# Patient Record
Sex: Female | Born: 1950 | Race: White | Hispanic: No | Marital: Married | Smoking: Never smoker
Health system: Southern US, Community
[De-identification: ages and names within clinical notes are randomized; demographics above are authoritative.]

## PROBLEM LIST (undated history)

## (undated) DIAGNOSIS — E785 Hyperlipidemia, unspecified: Secondary | ICD-10-CM

## (undated) DIAGNOSIS — J302 Other seasonal allergic rhinitis: Secondary | ICD-10-CM

## (undated) DIAGNOSIS — Z972 Presence of dental prosthetic device (complete) (partial): Secondary | ICD-10-CM

## (undated) DIAGNOSIS — I10 Essential (primary) hypertension: Secondary | ICD-10-CM

## (undated) DIAGNOSIS — N951 Menopausal and female climacteric states: Secondary | ICD-10-CM

## (undated) DIAGNOSIS — R911 Solitary pulmonary nodule: Secondary | ICD-10-CM

## (undated) DIAGNOSIS — K5792 Diverticulitis of intestine, part unspecified, without perforation or abscess without bleeding: Secondary | ICD-10-CM

## (undated) DIAGNOSIS — Z87442 Personal history of urinary calculi: Secondary | ICD-10-CM

## (undated) HISTORY — DX: Diverticulitis of intestine, part unspecified, without perforation or abscess without bleeding: K57.92

## (undated) HISTORY — DX: Menopausal and female climacteric states: N95.1

## (undated) HISTORY — PX: APPENDECTOMY: SHX54

## (undated) HISTORY — PX: COLONOSCOPY: SHX174

## (undated) HISTORY — DX: Essential (primary) hypertension: I10

---

## 1999-06-07 ENCOUNTER — Other Ambulatory Visit: Admission: RE | Admit: 1999-06-07 | Discharge: 1999-06-07 | Payer: Self-pay | Admitting: *Deleted

## 1999-08-20 ENCOUNTER — Ambulatory Visit (HOSPITAL_BASED_OUTPATIENT_CLINIC_OR_DEPARTMENT_OTHER): Admission: RE | Admit: 1999-08-20 | Discharge: 1999-08-20 | Payer: Self-pay

## 2000-10-15 ENCOUNTER — Encounter (INDEPENDENT_AMBULATORY_CARE_PROVIDER_SITE_OTHER): Payer: Self-pay | Admitting: Specialist

## 2000-10-15 ENCOUNTER — Inpatient Hospital Stay (HOSPITAL_COMMUNITY): Admission: EM | Admit: 2000-10-15 | Discharge: 2000-10-16 | Payer: Self-pay | Admitting: *Deleted

## 2000-12-22 ENCOUNTER — Other Ambulatory Visit: Admission: RE | Admit: 2000-12-22 | Discharge: 2000-12-22 | Payer: Self-pay | Admitting: *Deleted

## 2000-12-22 ENCOUNTER — Encounter: Admission: RE | Admit: 2000-12-22 | Discharge: 2000-12-22 | Payer: Self-pay | Admitting: *Deleted

## 2003-09-08 ENCOUNTER — Other Ambulatory Visit: Admission: RE | Admit: 2003-09-08 | Discharge: 2003-09-08 | Payer: Self-pay | Admitting: *Deleted

## 2004-03-07 ENCOUNTER — Encounter: Payer: Self-pay | Admitting: Gastroenterology

## 2004-11-24 HISTORY — PX: BREAST BIOPSY: SHX20

## 2005-09-15 ENCOUNTER — Ambulatory Visit: Payer: Self-pay | Admitting: *Deleted

## 2005-10-20 ENCOUNTER — Ambulatory Visit: Payer: Self-pay | Admitting: *Deleted

## 2007-12-01 ENCOUNTER — Ambulatory Visit: Payer: Self-pay | Admitting: Family Medicine

## 2008-11-15 ENCOUNTER — Telehealth: Payer: Self-pay | Admitting: Gastroenterology

## 2008-11-21 ENCOUNTER — Ambulatory Visit: Payer: Self-pay | Admitting: Internal Medicine

## 2008-11-21 DIAGNOSIS — R1032 Left lower quadrant pain: Secondary | ICD-10-CM

## 2008-11-21 DIAGNOSIS — R933 Abnormal findings on diagnostic imaging of other parts of digestive tract: Secondary | ICD-10-CM

## 2008-11-21 DIAGNOSIS — K5732 Diverticulitis of large intestine without perforation or abscess without bleeding: Secondary | ICD-10-CM | POA: Insufficient documentation

## 2008-12-04 ENCOUNTER — Telehealth: Payer: Self-pay | Admitting: Physician Assistant

## 2008-12-11 ENCOUNTER — Ambulatory Visit: Payer: Self-pay | Admitting: Cardiology

## 2008-12-13 ENCOUNTER — Ambulatory Visit: Payer: Self-pay | Admitting: Gastroenterology

## 2009-01-19 ENCOUNTER — Ambulatory Visit: Payer: Self-pay | Admitting: Family Medicine

## 2010-01-23 ENCOUNTER — Ambulatory Visit: Payer: Self-pay | Admitting: Family Medicine

## 2011-04-11 NOTE — Op Note (Signed)
[facility]  Patient:    Anita Hutchinson, Anita Hutchinson                       MRN: [id number] Proc. Date: 10/15/00 Adm. Date:  [id number] Attending:  Brandy Hale CC:         Kizzie Furnish, M.D.  Pershing Cox, M.D.   Operative Report  PREOPERATIVE DIAGNOSIS:  Acute appendicitis.  POSTOPERATIVE DIAGNOSIS:  Acute appendicitis.  OPERATION PERFORMED:  Laparoscopic appendectomy.  SURGEON:  Angelia Mould. Derrell Lolling, M.D.  OPERATIVE INDICATIONS:  This is a 60 year old white female who presented to the [facility] Emergency Room with a 16-hour history of progressive periumbilical and right lower quadrant pain and nausea.  On examination, she had localized tenderness, guarding and direct rebound in the right lower quadrant.  White blood cell count was elevated to 13,800.  It was felt that she most likely had acute appendicitis and she was brought to the operating room for appendectomy.  OPERATIVE FINDINGS:  The patient had acute appendicitis.  The appendix was inflamed and discolored, it had some exudate it on it but it was not obviously gangrenous and was clearly not ruptured.  The cecum and right colon looked normal.  The terminal ileum was normal in appearance.  The uterus was of normal size.  The ovaries were not enlarged.  The liver and gallbladder and stomach looked normal.  There was no abscess.  OPERATIVE TECHNIQUE:  Following the induction of general endotracheal anesthesia, a Foley catheter was inserted and the patients abdomen was prepped and draped in a sterile fashion.  Marcaine 0.5% with epinephrine was used as a local infiltration anesthetic.  A vertical incision was made in the upper rim of the umbilicus.  The fascia was incised in the midline and the abdominal cavity entered under direct vision.  A 10-mm Hasson trocar was inserted and secured with a pursestring suture of 0 Vicryl; pneumoperitoneum was created.  Video camera was inserted, with  visualization and findings as described above.  A 5-mm trocar was placed in the right upper quadrant and a 12-mm trocar placed in the left suprapubic region.  The appendix was identified and elevated.  We used the harmonic scalpel for most of the dissection.  We divided the lateral peritoneal attachments and mobilized the appendix and the cecum up and away from the retroperitoneum.  We were then able to encircle the body of the appendix with an Endoloop tie.  We divided some of the mesoappendix with the harmonic scalpel.  We then isolated the base of the appendix and identified its junction with the cecum.  An Endo GIA stapling device was inserted around the appendix, closed, fired and removed; this divided the appendix very neatly at the appendix and cecum junction.  We then divided the rest of the mesoappendix with the harmonic scalpel, placed the appendix in a specimen bag and removed it.  The pelvis, operative field and right upper quadrant were then copiously irrigated with saline.  There was no bleeding whatsoever at the completion of the case.  We removed all the fluid from the abdomen.  We removed the trocar in the left lower quadrant and we had some bleeding from that trocar site; this was controlled with a 0 Vicryl suture placed full-thickness through the fascia and peritoneum using the Endoclose device; this provided excellent hemostasis.  The rest of the trocars were removed under direct vision and there was no bleeding from the trocar sites.  The pneumoperitoneum was released.  The fascia at the umbilicus was closed with 0 Vicryl suture.  The skin incision was closed with subcuticular sutures of 4-0 Vicryl and Steri-Strips.  Clean bandages were placed and the patient taken to the recovery room in stable condition. Estimated blood loss was about 25 cc.  Complications:  None.  Sponge, needle and instrument counts were correct. DD:  10/15/00 TD:  10/16/00 Job:  [id number]

## 2011-04-11 NOTE — H&P (Signed)
[facility]  Patient:    Anita Hutchinson, HACH                       MRN: [id number] Adm. Date:  [id number] Attending:  Brandy Hale CC:         Kizzie Furnish, M.D.  Garvin Fila, M.D.   History and Physical  CHIEF COMPLAINT:  Right lower quadrant pain and nausea.  HISTORY OF PRESENT ILLNESS:  This is a 60 year old white female in excellent health.  Yesterday, at 4:00 p.m., she developed some centralized abdominal pain which persisted through the night.  The pain became progressive and she came to the emergency room.  She states the pain has become more localized to the right lower quadrant.  It hurts when she coughs or sneezes and hurts to move or ride in a car.  She has been nauseated this morning, but has not vomited.  She denies fever.  She denies trauma.  She denies prior similar episodes.  Her last menstrual period was three years ago.  She does have a history of endometriosis.  She was evaluated by Dr. Chase Picket in the emergency department and he was concerned she had appendicitis and I was asked to see her for evaluation.  PAST MEDICAL HISTORY:  The patient is followed by Dr. Garvin Fila for routine gynecologic care.  She has had a D&C in the past and has been told she has endometriosis.  She had a right breast biopsy in 1999 for benign disease. She thinks Dr. Orpah Greek did this surgery.  She has no other medical or surgical problems.  CURRENT MEDICATIONS: 1. Actifed p.r.n. 2. Advil p.r.n. 3. Aspirin daily.  ALLERGIES:   None known.  SOCIAL HISTORY:  [place]  She is married.  They have two children and two grandchildren.  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  Her husband works as a carrier for the [employer].  She denies the use of tobacco.  She drinks one glass of wine about every other day.  FAMILY HISTORY:  Mother died at age 61 and had diabetes, severe peripheral vascular  disease and a pulmonary embolus.  Father died at age 28 and had insulin-dependent diabetes and had three strokes.  She has three sisters, one with diabetes.  She has three brothers, one with coronary artery disease.  REVIEW OF SYSTEMS:  All systems were reviewed and are negative except as described above.  PHYSICAL EXAMINATION:  GENERAL:  Pleasant, healthy, thin, middle-age white female in moderate distress from abdominal pain.  She moves slowly because of pain.  VITAL SIGNS:  Blood pressure 93/54, heart rate 75, respiratory rate 18, temperature 97.5.  HEENT:  Sclerae clear.  Extraocular movements intact.  Oropharynx clear.  NECK:  Supple, nontender, no mass, no thyromegaly, no adenopathy, no bruits.  LUNGS:  Clear to auscultation.  No CVA tenderness.  HEART:  Regular rate and rhythm.  Slightly tachycardic at the time of exam, no murmur.  ABDOMEN:  Not distended.  Hypoactive bowel sounds.  Soft.  She has localized tenderness.  Involuntary guarding.  Direct and referred rebound to the right lower quadrant.  There is no obvious mass.  Suprapubic area is nontender.  EXTREMITIES:  No edema, good pulses.  NEUROLOGIC:  Grossly within normal limits.  ADMISSION LABORATORY DATA:  White blood cell count 13,800, hemoglobin 13.1, complete metabolic panel essentially normal, amylase and lipase normal, urinalysis normal.  IMPRESSION:  1. Probable acute appendicitis. 2. History of endometriosis.  PLAN:  I advised the patient of her probable diagnosis.  She was advised to undergo diagnostic laparoscopy and appendectomy.  Differential diagnosis was discussed including Crohns disease, diverticulitis, gynecologic problems, viral illness and so forth.  Indications and details of the surgery were outlined.  Risks and complications were outlined in great detail, including, but not limited to bleeding, infection, injury to the intestinal track or urologic track, wound infection, conversion to  open laparotomy and other unforeseen complications.  She seems to understand all of these issues well. At this time, I have answered all the questions from she and her husband. They agree with this plan.  She will be started on antibiotics and will take her to the operating room this morning for appendectomy. DD:  10/15/00 TD:  10/15/00 Job: [id number]

## 2011-04-19 ENCOUNTER — Encounter: Payer: Self-pay | Admitting: Gastroenterology

## 2011-12-01 ENCOUNTER — Ambulatory Visit: Payer: Self-pay | Admitting: Family Medicine

## 2012-06-23 ENCOUNTER — Ambulatory Visit: Payer: Self-pay | Admitting: Family Medicine

## 2012-09-27 ENCOUNTER — Ambulatory Visit: Payer: Self-pay | Admitting: Family Medicine

## 2012-12-01 ENCOUNTER — Ambulatory Visit: Payer: Self-pay | Admitting: Family Medicine

## 2013-03-28 ENCOUNTER — Ambulatory Visit: Payer: Self-pay | Admitting: Family Medicine

## 2013-04-25 ENCOUNTER — Inpatient Hospital Stay: Payer: Self-pay | Admitting: Surgery

## 2013-04-25 LAB — CBC
HCT: 37.2 % (ref 35.0–47.0)
HGB: 13 g/dL (ref 12.0–16.0)
MCH: 32 pg (ref 26.0–34.0)
MCHC: 34.9 g/dL (ref 32.0–36.0)
MCV: 92 fL (ref 80–100)
Platelet: 320 10*3/uL (ref 150–440)
RBC: 4.06 10*6/uL (ref 3.80–5.20)
RDW: 13 % (ref 11.5–14.5)
WBC: 11.1 10*3/uL — ABNORMAL HIGH (ref 3.6–11.0)

## 2013-04-25 LAB — URINALYSIS, COMPLETE
Bilirubin,UR: NEGATIVE
Ketone: NEGATIVE
Ph: 6 (ref 4.5–8.0)
RBC,UR: 2 /HPF (ref 0–5)
Specific Gravity: 1.008 (ref 1.003–1.030)
Squamous Epithelial: 4
WBC UR: 2 /HPF (ref 0–5)

## 2013-04-25 LAB — COMPREHENSIVE METABOLIC PANEL
Albumin: 3.3 g/dL — ABNORMAL LOW (ref 3.4–5.0)
Alkaline Phosphatase: 161 U/L — ABNORMAL HIGH (ref 50–136)
Anion Gap: 7 (ref 7–16)
BUN: 11 mg/dL (ref 7–18)
Bilirubin,Total: 0.6 mg/dL (ref 0.2–1.0)
Calcium, Total: 9.2 mg/dL (ref 8.5–10.1)
Chloride: 104 mmol/L (ref 98–107)
Co2: 29 mmol/L (ref 21–32)
Creatinine: 0.55 mg/dL — ABNORMAL LOW (ref 0.60–1.30)
EGFR (African American): >60
EGFR (Non-African Amer.): >60
Glucose: 104 mg/dL — ABNORMAL HIGH (ref 65–99)
Osmolality: 279 (ref 275–301)
Potassium: 4 mmol/L (ref 3.5–5.1)
SGOT(AST): 52 U/L — ABNORMAL HIGH (ref 15–37)
SGPT (ALT): 94 U/L — ABNORMAL HIGH (ref 12–78)
Sodium: 140 mmol/L (ref 136–145)
Total Protein: 7.9 g/dL (ref 6.4–8.2)

## 2013-04-25 LAB — PROTIME-INR: INR: 1

## 2013-04-25 LAB — LIPASE, BLOOD: Lipase: 150 U/L (ref 73–393)

## 2013-04-26 LAB — COMPREHENSIVE METABOLIC PANEL
Alkaline Phosphatase: 139 U/L — ABNORMAL HIGH (ref 50–136)
BUN: 6 mg/dL — ABNORMAL LOW (ref 7–18)
Bilirubin,Total: 0.3 mg/dL (ref 0.2–1.0)
Calcium, Total: 8.6 mg/dL (ref 8.5–10.1)
Chloride: 109 mmol/L — ABNORMAL HIGH (ref 98–107)
Creatinine: 0.6 mg/dL (ref 0.60–1.30)
EGFR (African American): >60
EGFR (Non-African Amer.): >60
Total Protein: 6.8 g/dL (ref 6.4–8.2)

## 2013-04-26 LAB — CBC WITH DIFFERENTIAL/PLATELET
Basophil #: 0 10*3/uL (ref 0.0–0.1)
Basophil %: 0.5 %
Eosinophil %: 5.1 %
HCT: 34.6 % — ABNORMAL LOW (ref 35.0–47.0)
Lymphocyte #: 1.7 10*3/uL (ref 1.0–3.6)
Lymphocyte %: 22.8 %
MCH: 31.8 pg (ref 26.0–34.0)
MCHC: 34.6 g/dL (ref 32.0–36.0)
Monocyte #: 0.8 x10 3/mm (ref 0.2–0.9)
Monocyte %: 10.4 %
Neutrophil %: 61.2 %
RDW: 12.6 % (ref 11.5–14.5)
WBC: 7.3 10*3/uL (ref 3.6–11.0)

## 2013-04-27 LAB — PATHOLOGY REPORT

## 2013-05-18 ENCOUNTER — Inpatient Hospital Stay: Payer: Self-pay | Admitting: Surgery

## 2013-05-18 LAB — CBC WITH DIFFERENTIAL/PLATELET
Basophil #: 0 10*3/uL (ref 0.0–0.1)
Basophil %: 0.4 %
Eosinophil #: 0.1 10*3/uL (ref 0.0–0.7)
Eosinophil %: 1.2 %
HGB: 13.8 g/dL (ref 12.0–16.0)
Lymphocyte %: 8 %
Monocyte #: 0.5 x10 3/mm (ref 0.2–0.9)
Neutrophil #: 9.7 10*3/uL — ABNORMAL HIGH (ref 1.4–6.5)
RDW: 13.2 % (ref 11.5–14.5)
WBC: 11.3 10*3/uL — ABNORMAL HIGH (ref 3.6–11.0)

## 2013-05-18 LAB — BASIC METABOLIC PANEL
Anion Gap: 7 (ref 7–16)
Calcium, Total: 9.2 mg/dL (ref 8.5–10.1)
Chloride: 105 mmol/L (ref 98–107)
Co2: 26 mmol/L (ref 21–32)
Creatinine: 0.66 mg/dL (ref 0.60–1.30)
EGFR (African American): >60
EGFR (Non-African Amer.): >60
Glucose: 94 mg/dL (ref 65–99)
Potassium: 3.9 mmol/L (ref 3.5–5.1)
Sodium: 138 mmol/L (ref 136–145)

## 2013-05-19 LAB — CBC WITH DIFFERENTIAL/PLATELET
Basophil #: 0 10*3/uL (ref 0.0–0.1)
Basophil %: 0.4 %
Eosinophil #: 0.4 10*3/uL (ref 0.0–0.7)
Eosinophil %: 4.8 %
HCT: 39.5 % (ref 35.0–47.0)
HGB: 13.6 g/dL (ref 12.0–16.0)
Lymphocyte %: 15.9 %
MCHC: 34.5 g/dL (ref 32.0–36.0)
Monocyte #: 0.7 x10 3/mm (ref 0.2–0.9)
Monocyte %: 7.4 %
Neutrophil #: 6.6 10*3/uL — ABNORMAL HIGH (ref 1.4–6.5)
Neutrophil %: 71.5 %
Platelet: 253 10*3/uL (ref 150–440)
RDW: 12.9 % (ref 11.5–14.5)

## 2013-05-19 LAB — BASIC METABOLIC PANEL
Anion Gap: 8 (ref 7–16)
BUN: 3 mg/dL — ABNORMAL LOW (ref 7–18)
Calcium, Total: 8.9 mg/dL (ref 8.5–10.1)
Co2: 26 mmol/L (ref 21–32)
Creatinine: 0.69 mg/dL (ref 0.60–1.30)
EGFR (African American): >60
Glucose: 112 mg/dL — ABNORMAL HIGH (ref 65–99)
Osmolality: 280 (ref 275–301)
Sodium: 142 mmol/L (ref 136–145)

## 2013-06-09 ENCOUNTER — Other Ambulatory Visit: Payer: Self-pay | Admitting: Unknown Physician Specialty

## 2013-06-09 LAB — CLOSTRIDIUM DIFFICILE BY PCR

## 2013-06-12 LAB — STOOL CULTURE

## 2013-07-19 ENCOUNTER — Ambulatory Visit: Payer: Self-pay | Admitting: Unknown Physician Specialty

## 2013-11-02 IMAGING — CR DG CHEST 2V
1 series · 3 of 3 positions shown · non-contrast
Comparison: none

REASON FOR EXAM: pre-op
COMMENTS:

PROCEDURE:     DXR - DXR CHEST PA (OR AP) AND LATERAL  - April 25, 2013  [DATE]
RESULT:

[Series 1: w chest lat · 0.14mm/px · 3 of 3 slices shown]
[im 1/3]
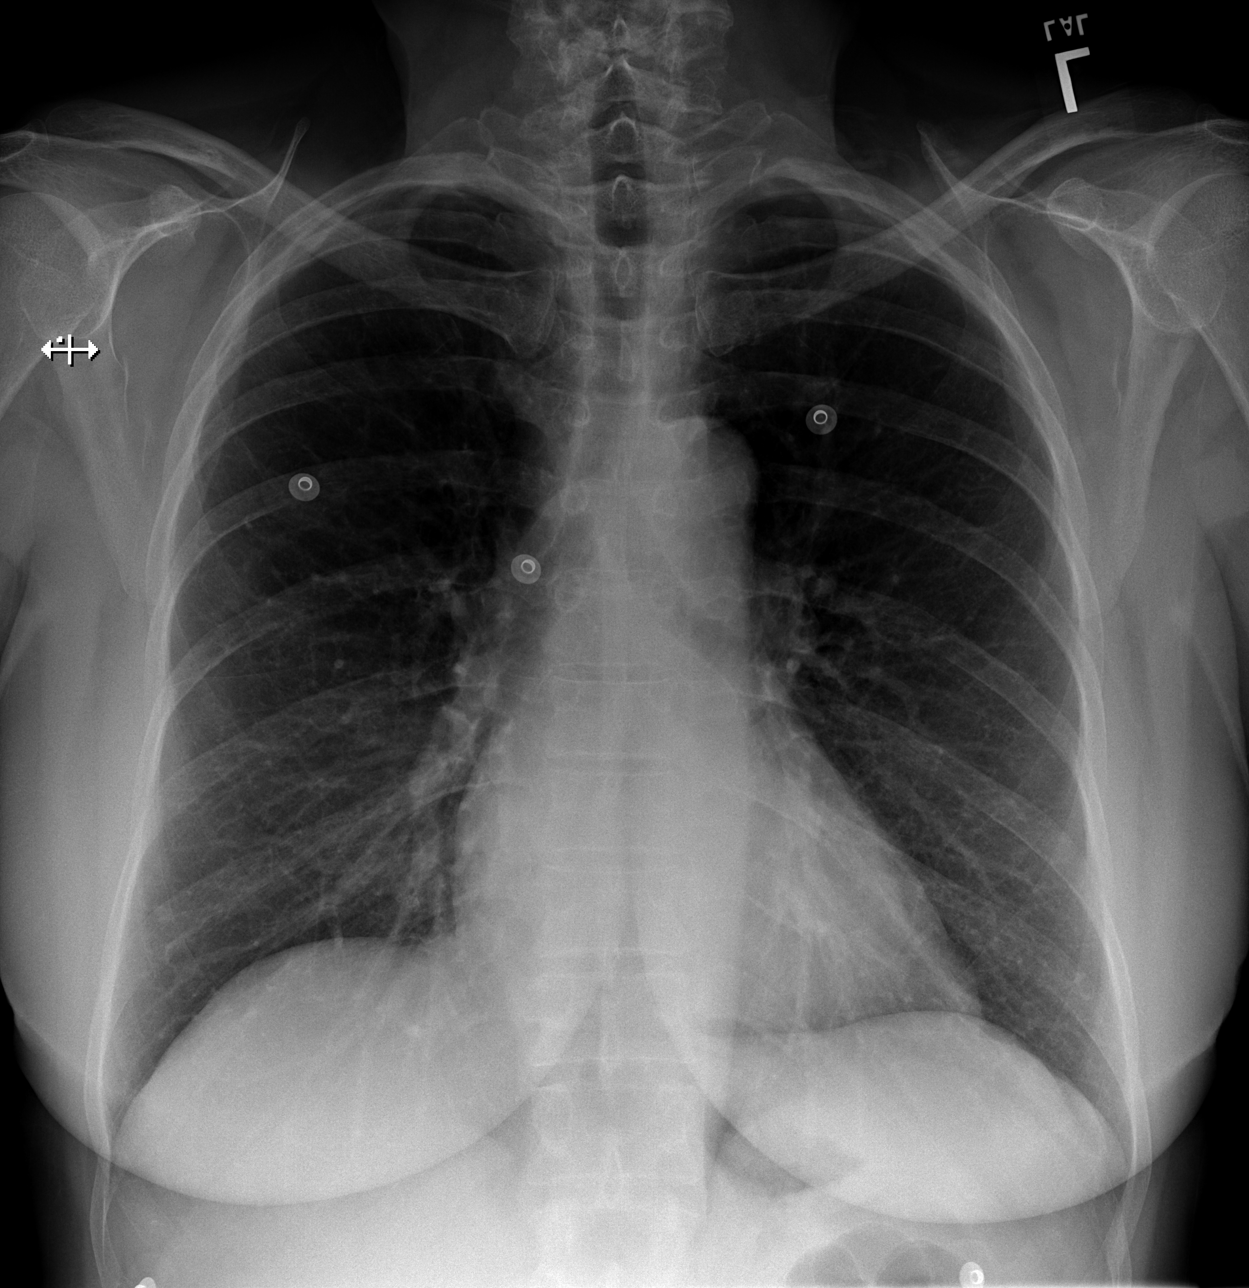
[im 2/3]
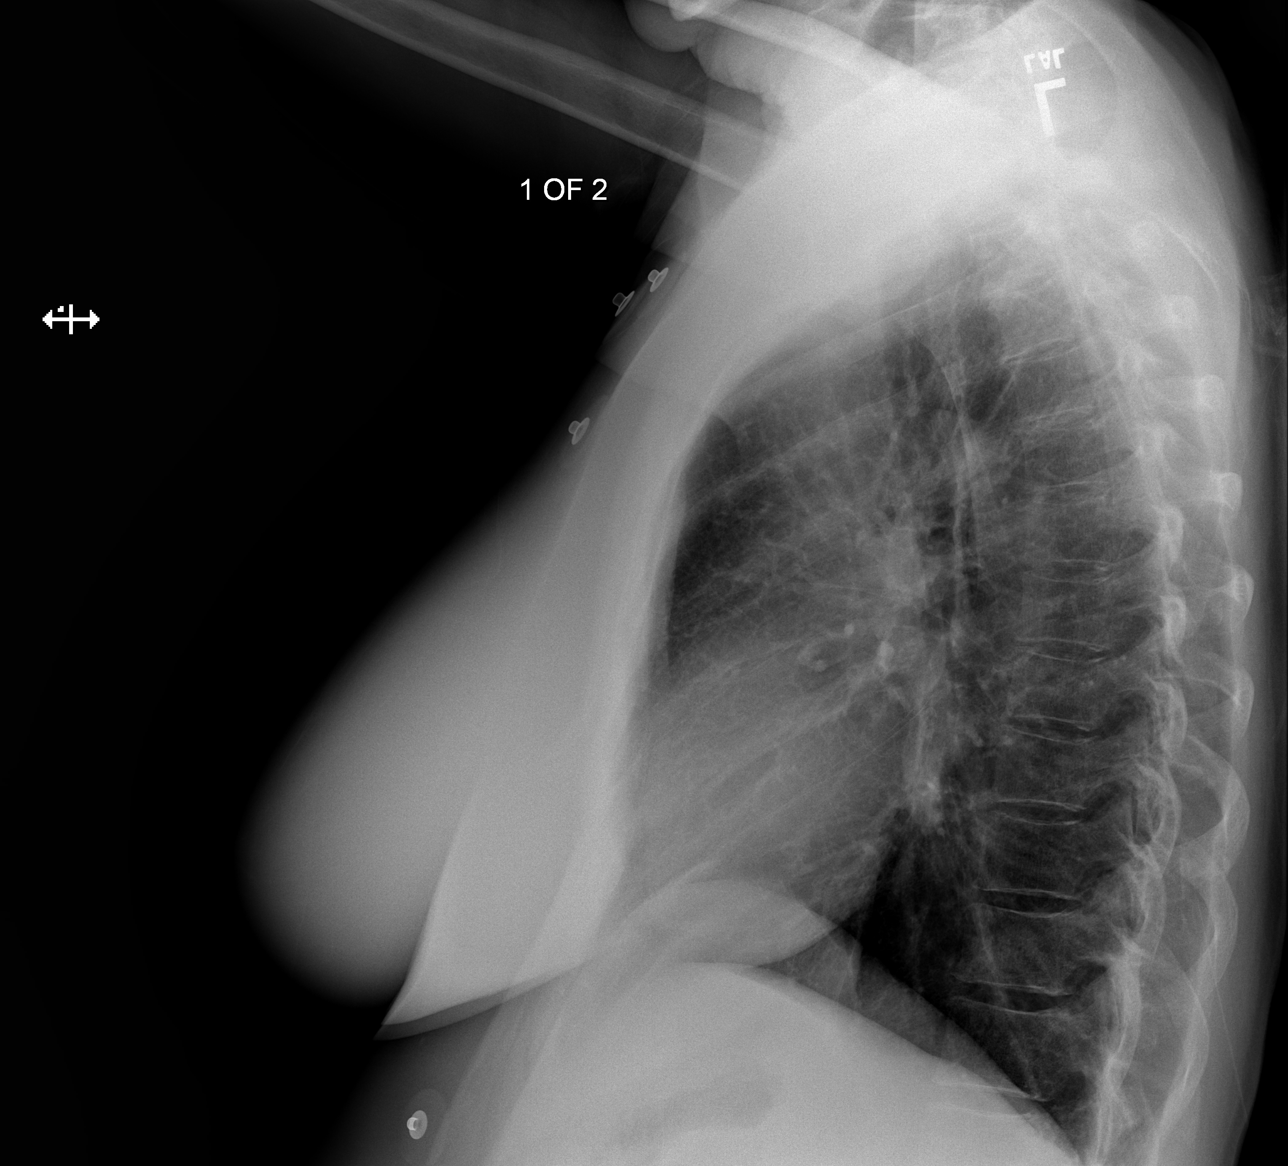
[im 3/3]
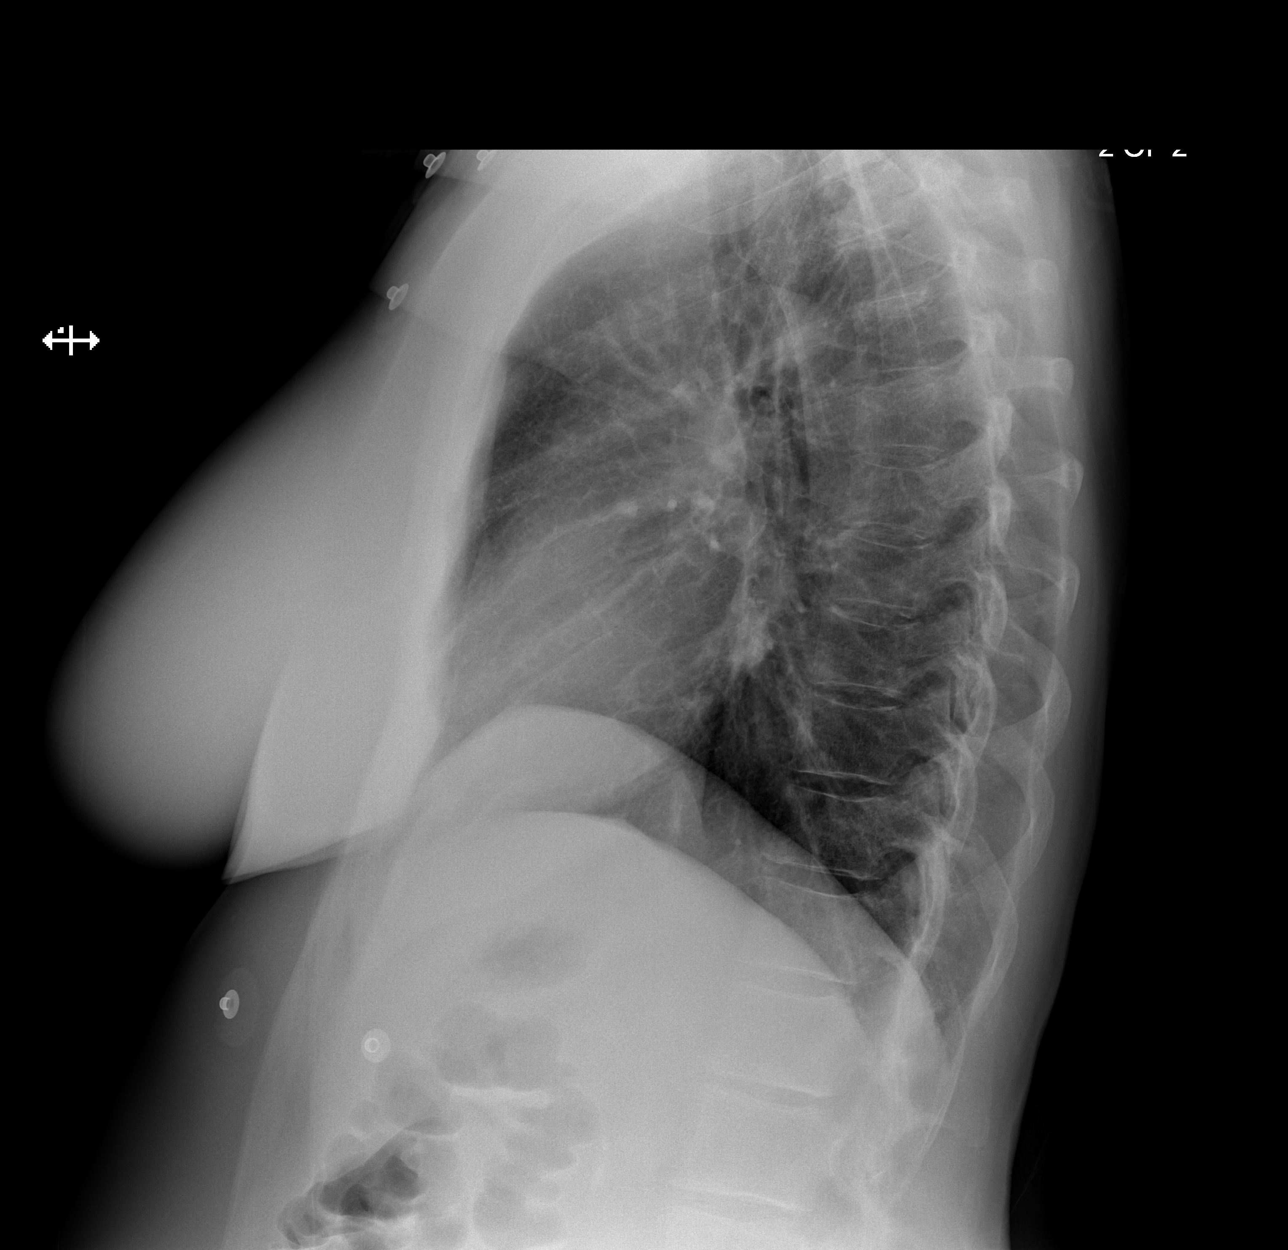

[3 of 3 positions shown; findings below may reference images not displayed]

FINDINGS: There is no evidence of focal infiltrates, effusions or edema. The
cardiac silhouette and visualized bony skeleton are unremarkable. A small
vague nodular appearing density projects along the peripheral base of the
right lower lobe. This appears to correlate with a stable nodule appreciated
on prior chest CT.
IMPRESSION: No evidence of acute cardiopulmonary disease.

## 2013-11-02 IMAGING — CT CT ABD-PELV W/ CM
1 of 2 series · 14 of 32 positions shown, 18 images · non-contrast
Comparison: none

REASON FOR EXAM: (1) lower abd pain; (2) lower abd pain
COMMENTS:

[Series 2: 3mm soft tissue · axial · 0.76mm/px · z∈[-482,-53]mm · 14 of 157 slices shown, 18 images]
[im 7/157  soft-tissue]
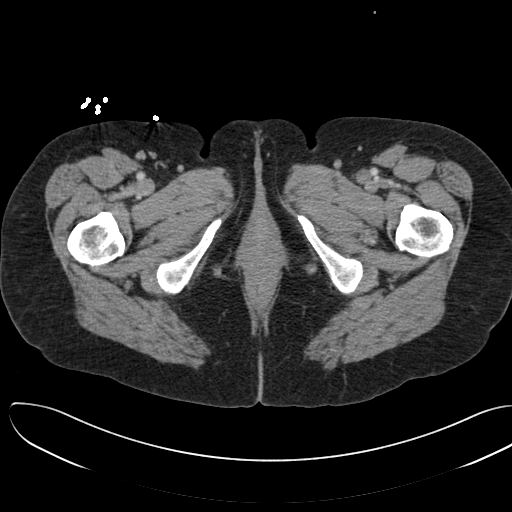
[im 7/157  bone]
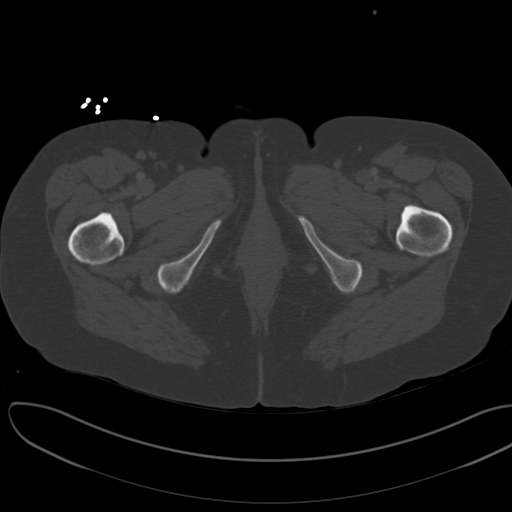
[im 21/157  soft-tissue]
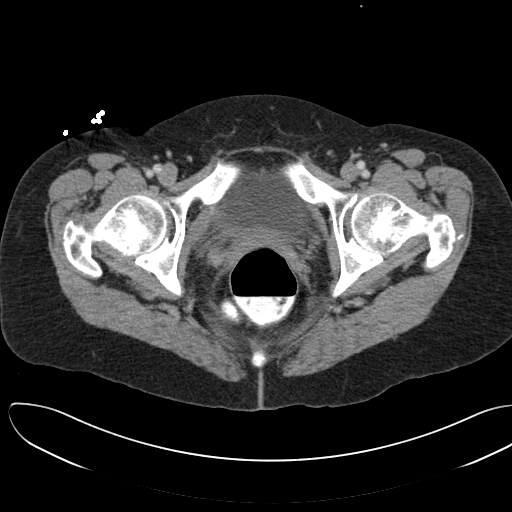
[im 34/157  soft-tissue]
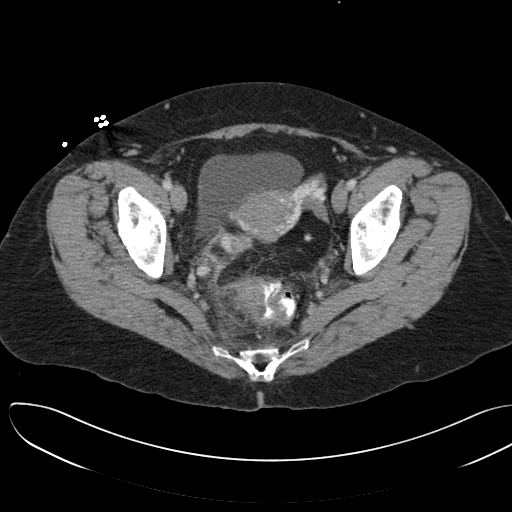
[im 48/157  soft-tissue]
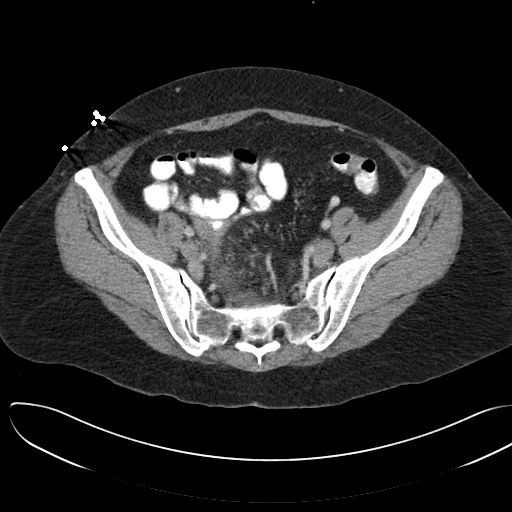
[im 62/157  soft-tissue]
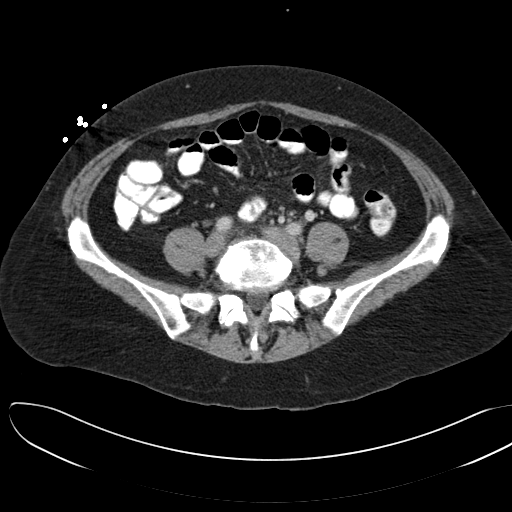
[im 75/157  soft-tissue]
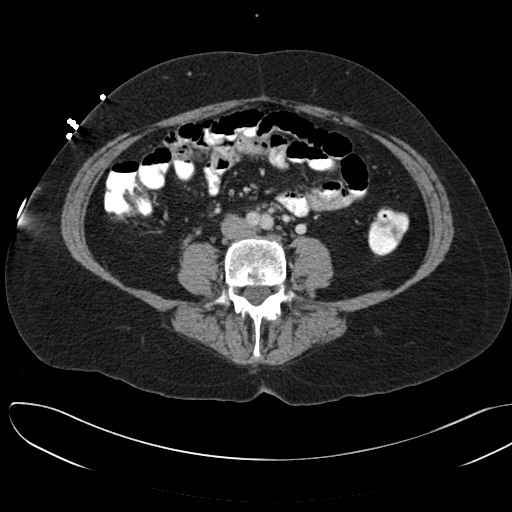
[im 82/157  soft-tissue]
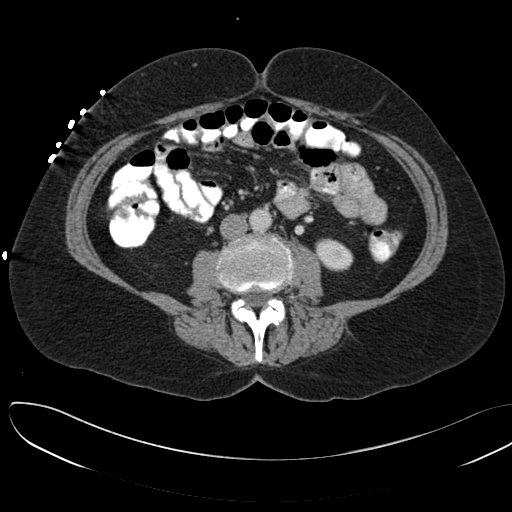
[im 95/157  soft-tissue]
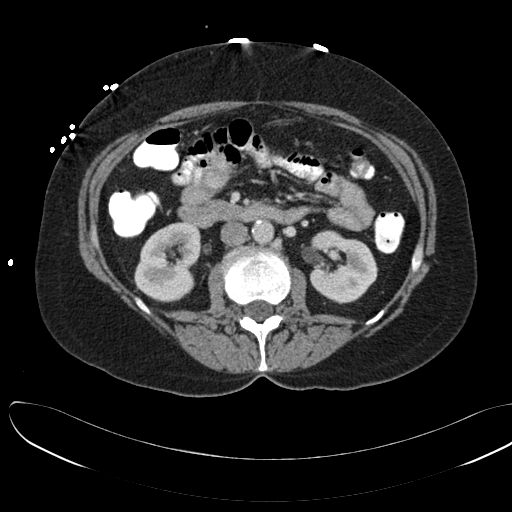
[im 109/157  soft-tissue]
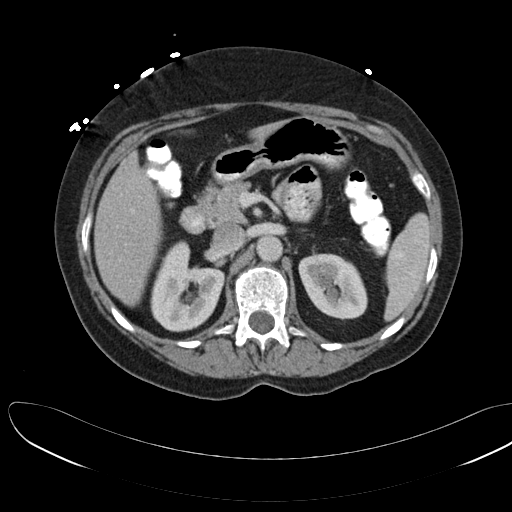
[im 109/157  bone]
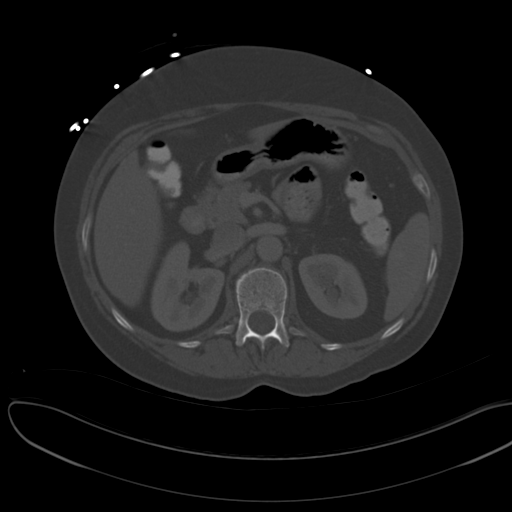
[im 123/157  soft-tissue]
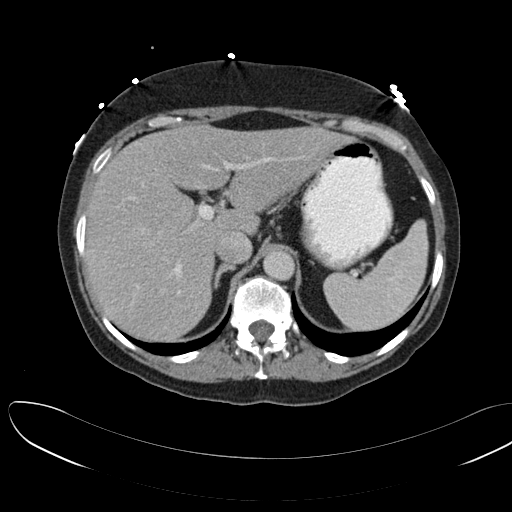
[im 129/157  lung]
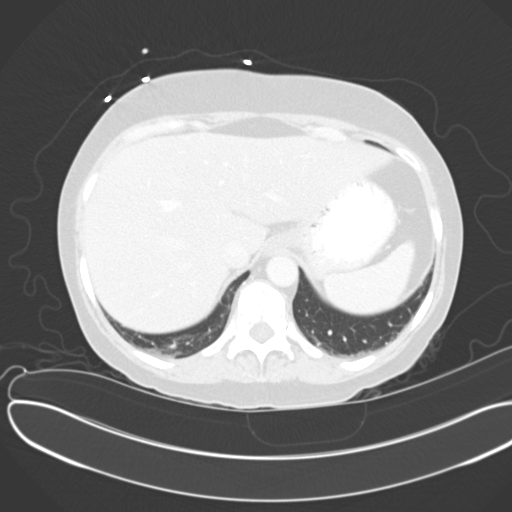
[im 136/157  soft-tissue]
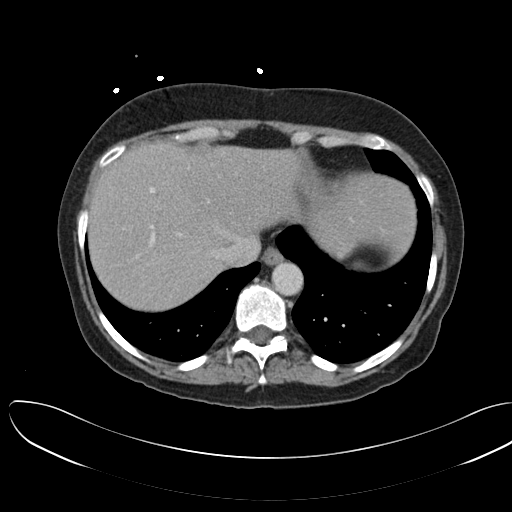
[im 136/157  lung]
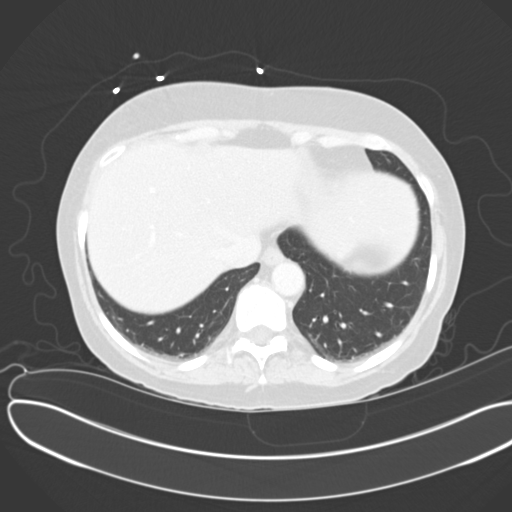
[im 143/157  lung]
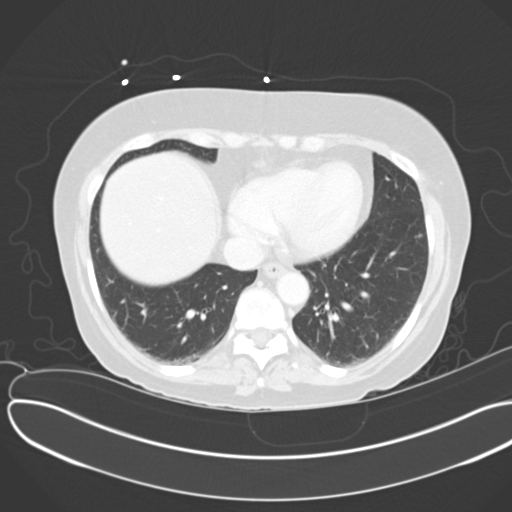
[im 150/157  soft-tissue]
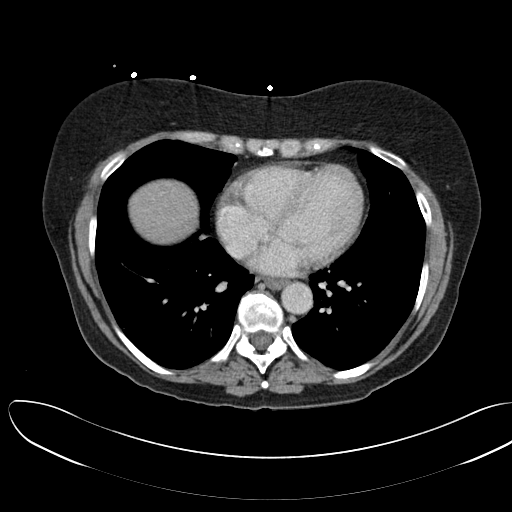
[im 150/157  lung]
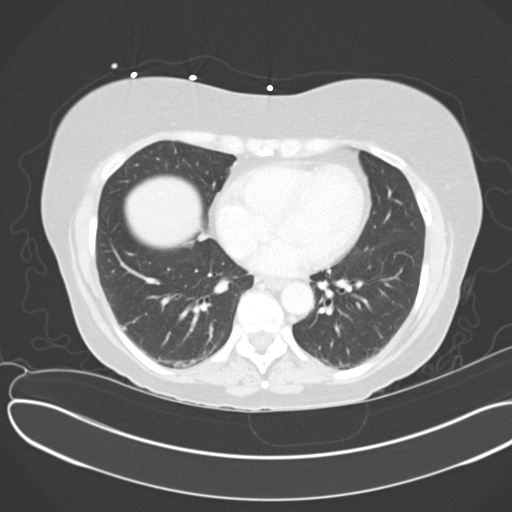

[14 of 32 positions shown; findings below may reference images not displayed]

PROCEDURE:     CT  - CT ABDOMEN / PELVIS  W  - April 25, 2013  [DATE]

RESULT:     CT of the abdomen and pelvis is performed with 85 mL of
Msovue-MDD iodinated intravenous contrast and oral contrast with images
reconstructed at 3.0 mm slice thickness in the axial plane compared to
previous studies of 06/23/2012. There is previous CT of the chest and upper
abdomen dated 09/27/2012 performed without contrast.

There is an abnormal appearance in the pelvis in the area of the
rectosigmoid portion of colon. There is extraluminal gas along the right
lateral aspect of the colon demonstrated between images 116 and 123. A
well-defined abscess is not appreciated. There is a small amount of free
fluid. There does not appear to be significant pneumoperitoneum. There is no
evidence of bowel obstruction as oral contrast has reached the rectum. There
is no extravasation of contrast evident. There significant bowel wall
thickening in the area of concern. There is extensive sigmoid colon
diverticulosis. The appearance could be secondary to acute diverticulitis
with perforation. The possibility of colitis or underlying malignancy is not
excluded. Diverticulosis is seen in the descending colon as well. The
kidneys show no obstruction or mass. No radiopaque gallstones or renal
calculi are evident. The liver shows no discrete mass. The liver and spleen
enhance normally. The pancreas is unremarkable. There is atherosclerotic
calcification within the abdominal aorta. The urinary bladder is
unremarkable. The uterus appears to be within normal limits. The appendix is
not definitely identified but there is no evidence of acute inflammatory
process to suggest acute appendicitis. The lung bases show some dependent
atelectasis. There is some mild degenerative disc narrowing at L2-L3 and
L1-L2 with hypertrophic endplate spurring. Presacral edema is present.
IMPRESSION: 1. Extraluminal air is seen adjacent to the distal right sigmoid colon and
rectosigmoid region with considerable thickening of the wall in the area the
proximal rectum to rectosigmoid and distal sigmoid region. There is a small
amount of free fluid present. Surrounding inflammatory stranding is present.
The changes could be secondary to evolving abscess formation from
perforation secondary to acute diverticulitis. The possibility of colitis or
underlying malignancy should also be considered.

[REDACTED](*)

## 2013-11-25 IMAGING — CT CT ABD-PELV W/ CM
1 of 2 series · 15 of 32 positions shown, 19 images · non-contrast
Comparison: none

REASON FOR EXAM: (1) recurrent diverticulitis; (2) same
COMMENTS:

[Series 2: soft tissue · axial · 0.70mm/px · z∈[-1000,-562]mm · 15 of 160 slices shown, 19 images]
[im 7/160  soft-tissue]
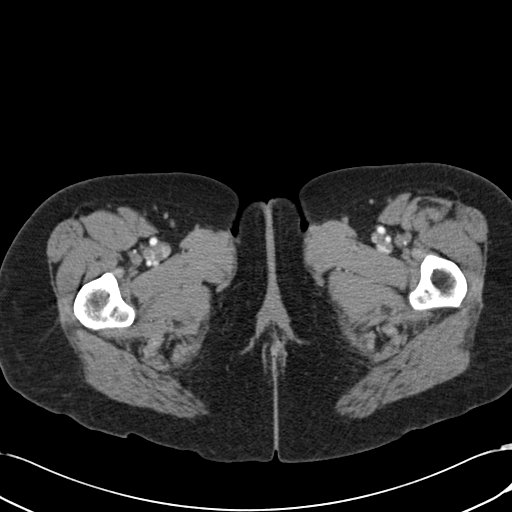
[im 7/160  bone]
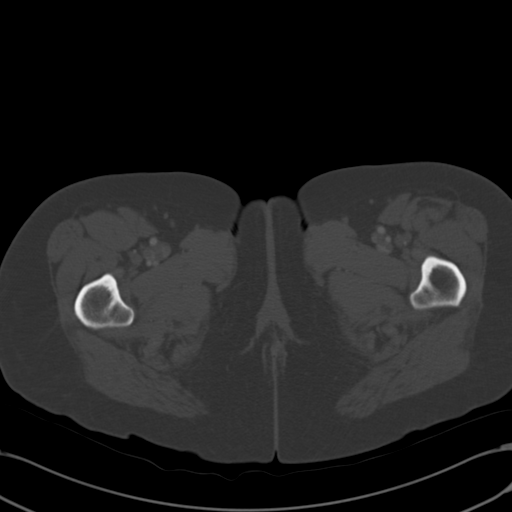
[im 21/160  soft-tissue]
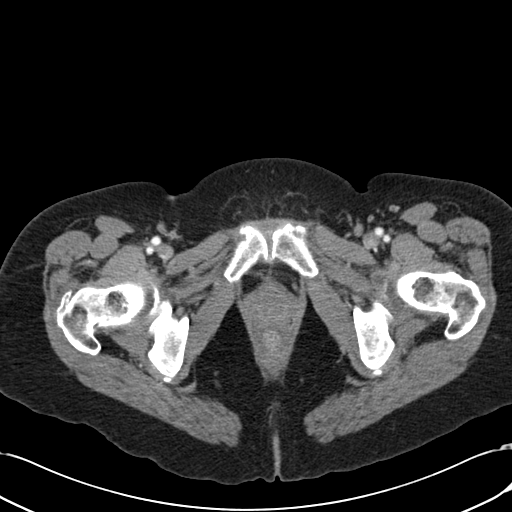
[im 35/160  soft-tissue]
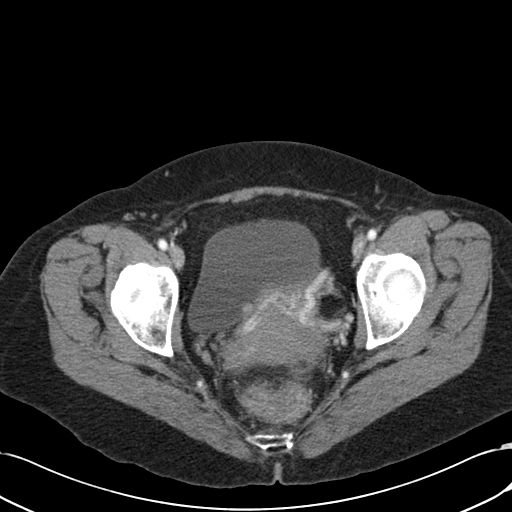
[im 42/160  soft-tissue]
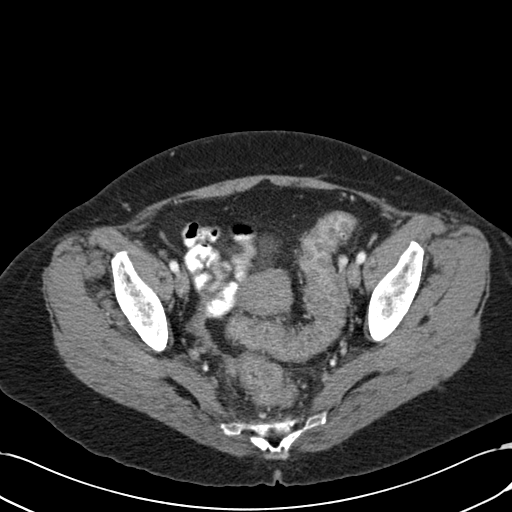
[im 56/160  soft-tissue]
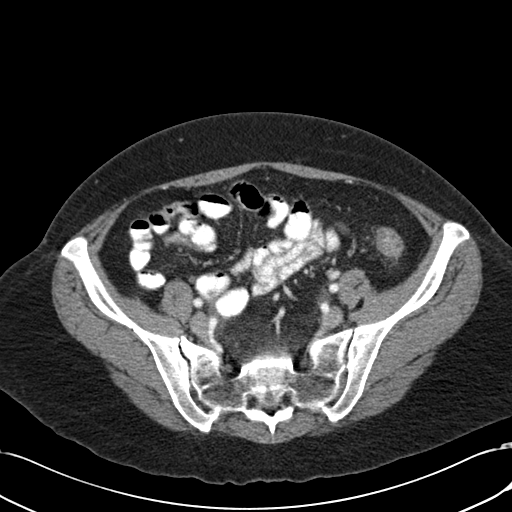
[im 70/160  soft-tissue]
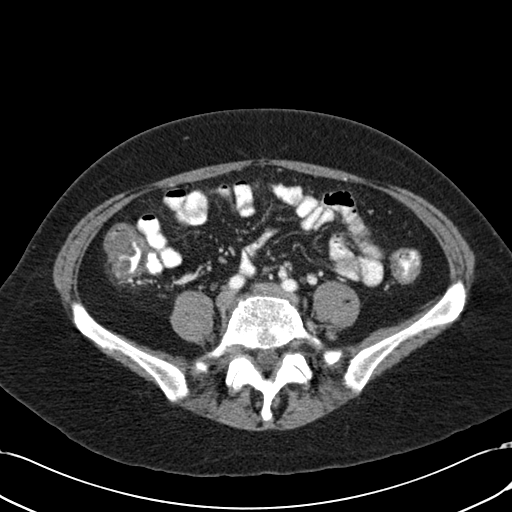
[im 83/160  soft-tissue]
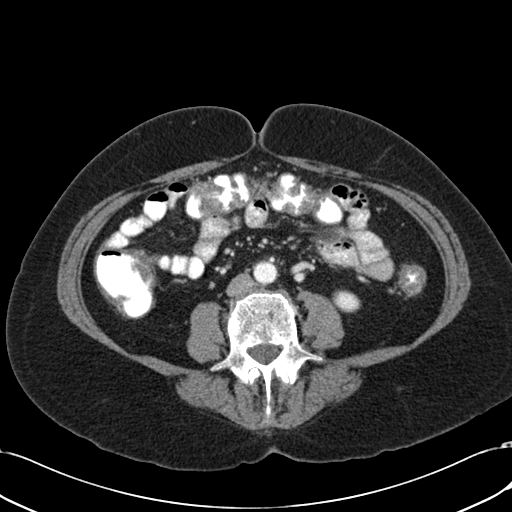
[im 90/160  soft-tissue]
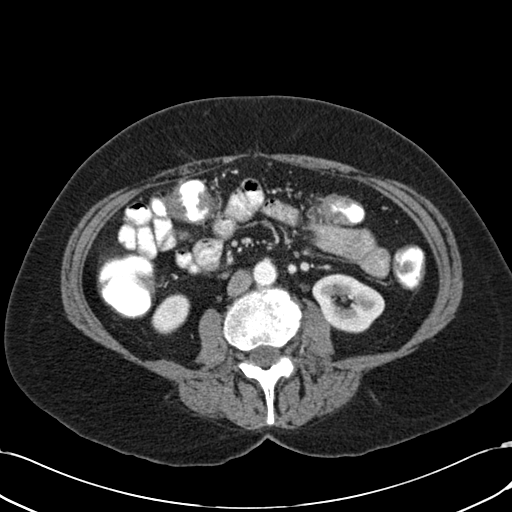
[im 104/160  soft-tissue]
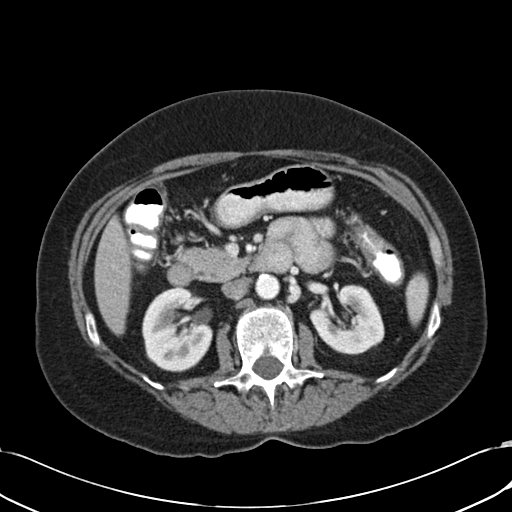
[im 104/160  bone]
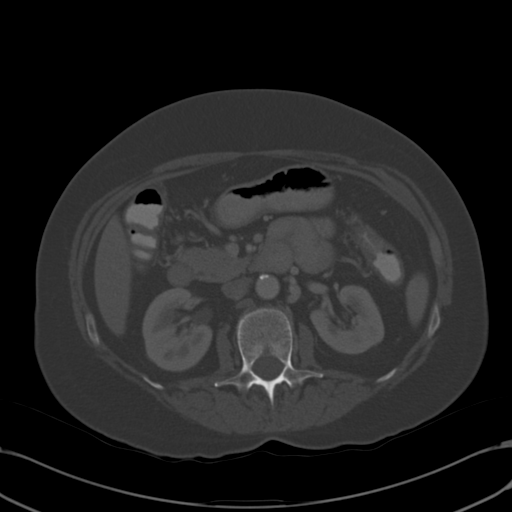
[im 118/160  soft-tissue]
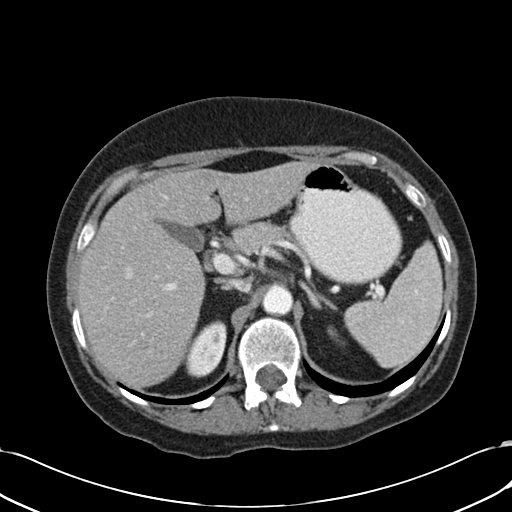
[im 125/160  soft-tissue]
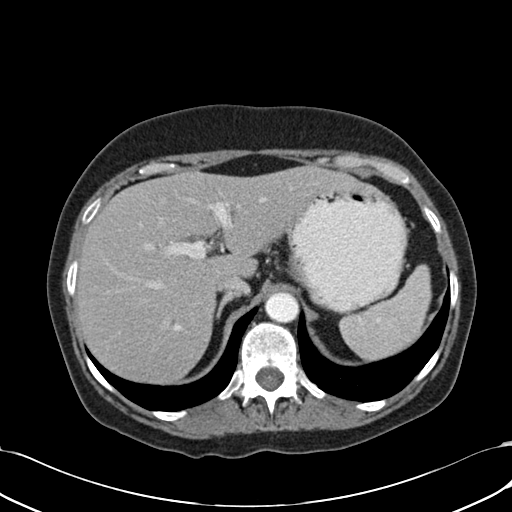
[im 132/160  lung]
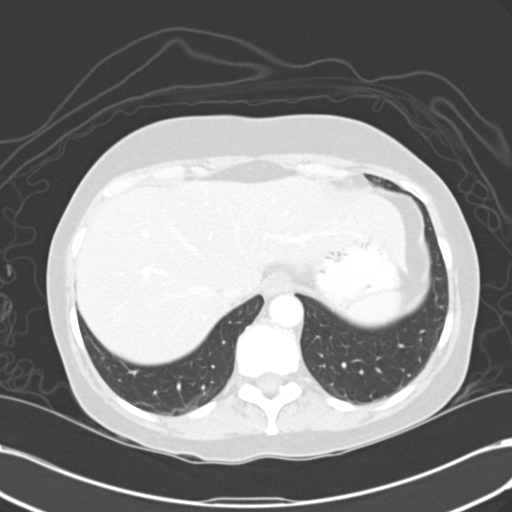
[im 139/160  soft-tissue]
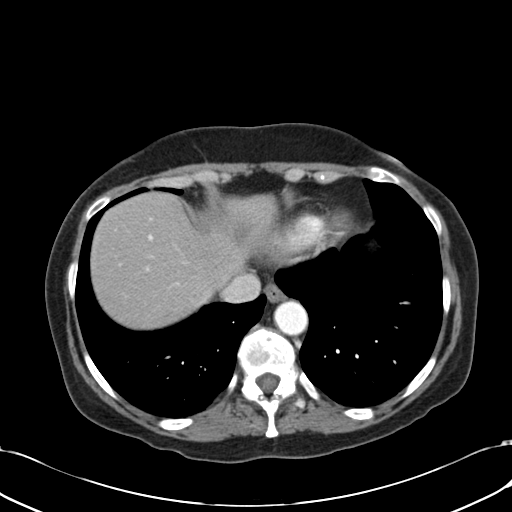
[im 139/160  lung]
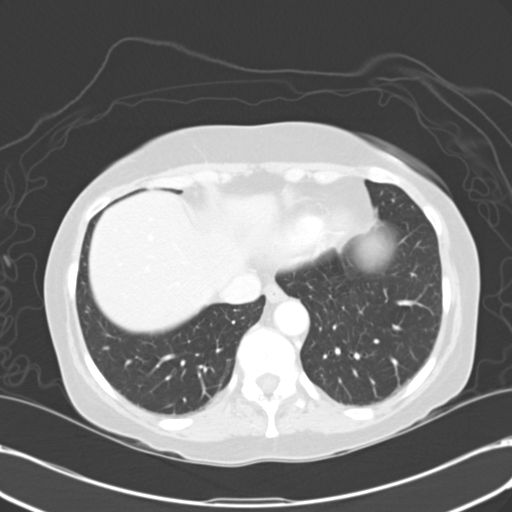
[im 146/160  lung]
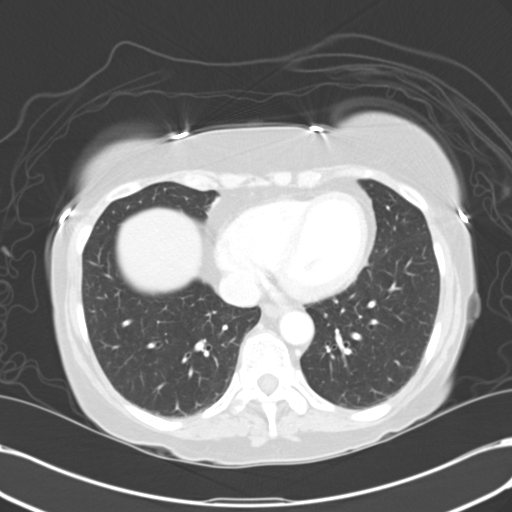
[im 153/160  soft-tissue]
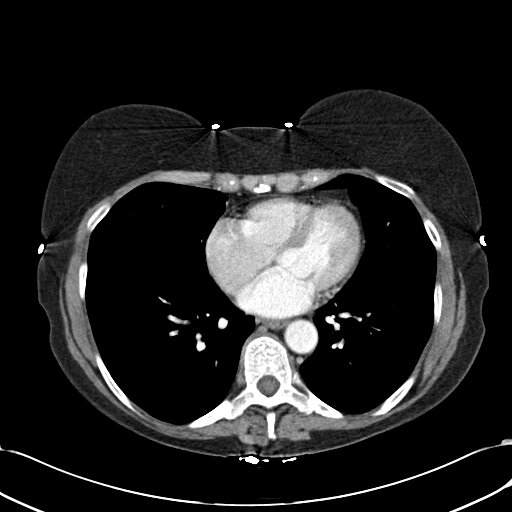
[im 153/160  lung]
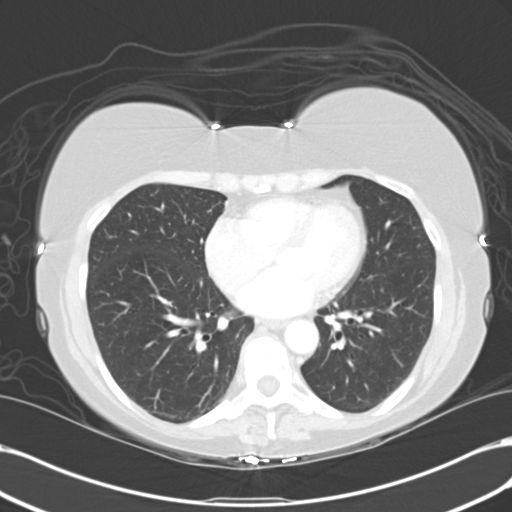

[15 of 32 positions shown; findings below may reference images not displayed]

PROCEDURE:     CT  - CT ABDOMEN / PELVIS  W  - May 18, 2013  [DATE]

RESULT:     CT of the abdomen and pelvis is performed with 100 mL of
Asovue-7MM iodinated intravenous contrast and oral contrast with images
reconstructed at 3 mm slice thickness in the axial plane compared to
previous images from a study dated 04/25/2013 and to an earlier study dated
06/23/2012.

Study show some mild thickening of the wall of the distal descending colon
and some adjacent inflammatory stranding with some diverticula in the
region. The appearance could be secondary to acute diverticulitis or mild
colitis. There is no evidence of perforation or abscess formation. There may
be some mild thickening of the wall of the ascending colon and portions of
transverse colon as well. This has not been seen on previous studies. The
liver and spleen appear to enhance homogeneously. No radiopaque gallstones
are evident. The adrenal glands, pancreas and kidneys appear to be
unremarkable. The abdominal aorta is normal in caliber. The gastric and
enteric appearance is normal. The uterus is unremarkable. There is a small
amount of urine in the urinary bladder. The lung bases show grossly normal
aeration without a discrete mass, effusion or pneumothorax.
IMPRESSION: Findings which could represent colitis. The thickening of
the wall the colon is somewhat intermittent and not extreme. This is more
focal in the descending colon distally where there is pericolonic
inflammatory stranding. Possibility of acute diverticulitis without
perforation or abscess formation should be a consideration.

[REDACTED]

## 2014-01-10 ENCOUNTER — Encounter: Payer: Self-pay | Admitting: Gastroenterology

## 2014-03-29 ENCOUNTER — Ambulatory Visit: Payer: Self-pay | Admitting: Family Medicine

## 2014-05-16 ENCOUNTER — Ambulatory Visit: Payer: Self-pay | Admitting: Family Medicine

## 2014-08-14 ENCOUNTER — Encounter: Payer: Self-pay | Admitting: Gastroenterology

## 2015-03-16 NOTE — H&P (Signed)
PATIENT NAME:  Anita Hutchinson, Anita Hutchinson MR#:  [id number] DATE OF BIRTH:  1951-09-14  DATE OF ADMISSION:  04/25/2013  HISTORY OF PRESENT ILLNESS: Anita Hutchinson is a 64 year old white female who has a five day history of rectal pressure and pain that is exacerbated by sitting as well as lower abdominal pain that is exacerbated by jarring her abdomen. She does not have any fever. For the last four days, she has had at least four diarrhea-type bowel movements per day and just today she noticed some blood on the toilet tissue paper and a few drops of blood in the toilet. She has not noticed any blood mixed in or streaked on the outside of her stools.   Anita Hutchinson was treated with multiple courses of IV antibiotics for diverticulitis 5 years ago. In July 2013, she had a very similar episode of rectal pain and suprapubic abdominal pain and at that time she underwent CT scan examination and a vaginal ultrasound; both of which were negative. She had a repeat episode of this pain in August 2013 with no additional evaluation and it resolved until now (10 months later).   The patient underwent colonoscopy of some kind 9 years ago.   PAST MEDICAL HISTORY: No medications. No medical illnesses.   ALLERGIES: PENICILLIN.   PAST SURGICAL HISTORY: None.   SOCIAL HISTORY: The patient is married and is attended in the hospital by her two children. She does not drink alcohol or use illicit drugs.   REVIEW OF SYSTEMS: Negative for 10 systems except the gastrointestinal system, as mentioned in the history of present illness.   PHYSICAL EXAMINATION: GENERAL: A pleasant, middle-aged white female lying comfortably in the hospital bed.  VITAL SIGNS: Height 5 feet 6 inches, weight 159 pounds, BMI 25.7. Temperature 98.2, pulse 81, respirations 18, blood pressure 120/77, oxygen saturation 95% on room air at rest.   HEENT: Pupils equally round and reactive to light. Extraocular movements intact. Sclerae anicteric. Oropharynx clear.  Mucous membranes moist. Hearing intact to voice.  NECK: Supple with no tracheal deviation or jugular venous distention.  HEART: Regular rate and rhythm. No murmurs or rubs.  LUNGS: Clear to auscultation with normal respiratory effort bilaterally.  ABDOMEN: Nondistended and soft in the upper abdomen and left lower quadrant, but the patient does have some rebound tenderness without guarding in the right suprapubic area.  RECTAL:  The anus and anal canal are normal. The distal rectal shelf palpates as normal but there is a bulge to the right side of the rectum that is extremely tender. I cannot definitively tell whether there is abnormal mucosa in this area or not, but there is no obvious distal rectal mass.  EXTREMITIES: No edema with normal capillary refill bilaterally.  NEUROLOGIC: Cranial nerves II through XII, motor and sensation grossly intact.  PSYCHIATRIC: Alert and oriented x4. Appropriate affect.   LABORATORY, DIAGNOSTIC, AND RADIOLOGICAL DATA: White blood cell count 11.1, hemoglobin 13.0, hematocrit 37.2%, platelet count 320,000. PT/INR and PTT normal. Urinalysis normal. Electrolytes normal. Lipase is normal, albumin 3.3, bilirubin 0.6, alkaline phosphatase 161, SGOT 52, SGPT 94   Chest x-ray normal.   CT scan of abdomen and pelvis with contrast reveals a right perirectal abscess with extraluminal air and considerable thickening of the wall of the rectosigmoid. Dr. Burt Knack did three-dimensional reconstruction of this and believes that the abscess begins just at or barely above the peritoneal reflection. There is no perianal process with this.   ASSESSMENT: Right supralevator perirectal abscess with no perianal process  but significant sigmoid and rectosigmoid diverticulitis.   PLAN: I reviewed the CT scan which radiologist and they do not believe that there is any appreciable fluid collection necessitating drainage, although drain could be placed percutaneously through a window that was  transsacral if she were to develop one.  Admit to the hospital, give her IV fluids, and IV antibiotics and I have discussed her Anita Hutchinson with Dr. Lucilla Lame, who plans to an evaluation of her mucosa tomorrow because of the bizarre location of the abscess. I do believe a rectal cancer or a foreign body needs to be ruled out. He will discuss with the patient the fact that immediate colonoscopy can worsen this but due to the bizarre location of the abscess, I believe that risk is warranted.   ____________________________ Consuela Mimes, MD wfm:cc D: 04/25/2013 17:11:14 ET T: 04/25/2013 17:41:59 ET JOB#: [id number]  cc: Consuela Mimes, MD, <Dictator> Consuela Mimes MD ELECTRONICALLY SIGNED 04/25/2013 19:01

## 2015-03-16 NOTE — Discharge Summary (Signed)
PATIENT NAME:  Anita Hutchinson, SZOSTAK MR#:  [id number] DATE OF BIRTH:  [birth year]  DATE OF ADMISSION:  05/18/2013 DATE OF DISCHARGE:  05/20/2013  FINAL DIAGNOSES: 1.  Clostridium difficile colitis.  2.  History of perforated sigmoid diverticulitis.   HOSPITAL COURSE SUMMARY:  The patient was admitted with abdominal pain.  Review of her CT scan was concerning for left-sided colitis.  There was no signs of perforation or abscess.  C. difficile toxin was obtained due to her diarrhea and this returned promptly as positive.  She was begun on oral Flagyl.  After several doses of oral Flagyl the patient had improved abdominal pain and much less in diarrhea.  She tolerated this well over the course of the next two hospital days.  Her diet was able to be advanced to regular.  She was discharged home on the morning of June 27th in stable condition with follow-up with Dr. Delice Lesch office at the end of the month for consideration of elective low anterior resection.   DISCHARGE MEDICATIONS: 1.  Yogurt with every meal.  2.  Metronidazole 500 mg by mouth every 8 hours for a total of 14 days.    ____________________________ Jeannette How Marina Gravel, MD Ricke Hey D: 06/02/2013 00:07:15 ET T: 06/02/2013 02:40:06 ET JOB#: [id number]  cc: Elta Guadeloupe A. Marina Gravel, MD, <Dictator> Sebastien Jackson A Eagle Pitta MD ELECTRONICALLY SIGNED 06/02/2013 21:29

## 2015-03-16 NOTE — Discharge Summary (Signed)
PATIENT NAME:  Anita Hutchinson, Anita Hutchinson MR#:  [id number] DATE OF BIRTH:  1951/05/16  DATE OF ADMISSION:  04/25/2013 DATE OF DISCHARGE:  04/27/2013  PRINCIPLE DIAGNOSIS:  Perforated rectosigmoid acute diverticulitis with high perirectal abscess.   PRINCIPLE PROCEDURE PERFORMED DURING THIS ADMISSION:  Colonoscopy.   HOSPITAL COURSE:  [name] was admitted to the hospital, made nothing by mouth, placed on IV antibiotics and I reviewed her CT scan with the radiologist who did a three-dimensional reconstruction on it and said that he believed the perforation to be just barely above the peritoneal reflection.  Her abscess cavity was thick-walled and phlegmon and contained air, but essentially no pus that would require drainage.  She underwent colonoscopy on June 3rd by Dr. Lucilla Lame and he saw an entirely normal rectum with an angry diverticulum at 20 cm from the anal verge.  She had her diet advanced to a low fiber diet and was instructed in a low fiber diet and remained afebrile and her white blood cell count returned to normal, and therefore she was discharged home on Ceftin 500 mg q. 12 hours for an additional 14 days as an outpatient.  She was to eat her low fiber diet for three weeks and then over the course of the following week gradually increase the fiber in her diet and also increase her fluid intake to include 6 to 8 glasses of water per day.  She was to see me in the office 4 weeks from now at which time we will schedule an elective low anterior resection.  We have already discussed the surgery and the very unlikely event that she will require a diverting loop ileostomy at the time.  She is also to call my office if she experiences a fever or a recurrence of her pain.     ____________________________ Consuela Mimes, MD wfm:ea D: 04/27/2013 14:45:36 ET T: 04/27/2013 18:50:07 ET JOB#: [id number]  cc: Consuela Mimes, MD, <Dictator> Consuela Mimes, MD  Consuela Mimes  MD ELECTRONICALLY SIGNED 04/28/2013 17:11

## 2015-03-16 NOTE — H&P (Signed)
Subjective/Chief Complaint LLQ abdominal pain diarrhea   History of Present Illness 64 y/o female recently admitted with sigmoid diverticulitis and a low contained microperforation around the peritoneal reflection.  Admits to at least two previous episodes of what sounds like diverticulitis in past.  Stopped abx last wed and developed recurrent abdominal pain and worsening diarrhea (20 times perday).  Seen in office earlier today and was a direct admission.  No sick contacts, no nausea or emesis.   Past History diverticulitis lap appendectomy   Primary Physician [facility].   Past Med/Surgical Hx:  Diverticulitis:   ALLERGIES:  Penicillin: Rash  HOME MEDICATIONS: Medication Instructions Status  Vagifem 10 mcg vaginal tablet 1 tab(s) vaginal 3 times a week on Monday, Wednesday, and Friday Active   Family and Social History:  Family History Non-Contributory   Social History negative tobacco, negative ETOH, negative Illicit drugs   Place of Living Home   Review of Systems:  Subjective/Chief Complaint see above   Physical Exam:  GEN no acute distress, thin   HEENT pale conjunctivae   NECK supple   RESP normal resp effort  clear BS   CARD regular rate   ABD positive tenderness  no liver/spleen enlargement  no hernia  soft  distended  most of findings are in LLQ.   LYMPH negative neck   EXTR negative cyanosis/clubbing, negative edema   SKIN normal to palpation, No rashes   NEURO cranial nerves intact, follows commands   PSYCH A+O to time, place, person, good insight   Lab Results: Routine Chem:  25-Jun-14 12:53   Glucose, Serum 94  BUN 8  Creatinine (comp) 0.66  Sodium, Serum 138  Potassium, Serum 3.9  Chloride, Serum 105  CO2, Serum 26  Calcium (Total), Serum 9.2  Anion Gap 7  Osmolality (calc) 274  eGFR (African American) >60  eGFR (Non-African American) >60 (eGFR values <47m/min/1.73 m2 may be an indication of chronic kidney disease  (CKD). Calculated eGFR is useful in patients with stable renal function. The eGFR calculation will not be reliable in acutely ill patients when serum creatinine is changing rapidly. It is not useful in  patients on dialysis. The eGFR calculation may not be applicable to patients at the low and high extremes of body sizes, pregnant women, and vegetarians.)  Routine Hem:  25-Jun-14 12:53   WBC (CBC)  11.3  RBC (CBC) 4.41  Hemoglobin (CBC) 13.8  Hematocrit (CBC) 40.1  Platelet Count (CBC) 258  MCV 91  MCH 31.2  MCHC 34.3  RDW 13.2  Neutrophil % 86.0  Lymphocyte % 8.0  Monocyte % 4.4  Eosinophil % 1.2  Basophil % 0.4  Neutrophil #  9.7  Lymphocyte #  0.9  Monocyte # 0.5  Eosinophil # 0.1  Basophil # 0.0 (Result(s) reported on 18 May 2013 at 01:21PM.)   Radiology Results: XRay:    02-Jun-14 08:04, Chest PA and Lateral  Chest PA and Lateral  REASON FOR EXAM:    pre-op  COMMENTS:       PROCEDURE: DXR - DXR CHEST PA (OR AP) AND LATERAL  - Apr 25 2013  8:04AM     RESULT:     Findings: There is no evidence of focal infiltrates, effusions or edema.   The cardiac silhouette and visualized bonyskeleton are unremarkable. A   small vague nodular appearing density projects along the peripheral base   of the right lower lobe. This appears to correlate with a stable nodule   appreciated on  prior chest CT.    IMPRESSION:  No evidence of acute cardiopulmonary disease.  Thank you for the opportunity to contribute to the care of your patient.         Verified By: Mikki Santee, M.D., MD  LabUnknown:    25-Jun-14 15:55, CT Abdomen and Pelvis With Contrast  PACS Image  CT:  CT Abdomen and Pelvis With Contrast  REASON FOR EXAM:    (1) recurrent diverticulitis; (2) same  COMMENTS:       PROCEDURE: CT  - CT ABDOMEN / PELVIS  W  - May 18 2013  3:55PM     RESULT: CT of the abdomen and pelvis is performed with 100 mL of   Isovue-300 iodinated intravenous contrast and oral contrast  with images   reconstructed at 3 mm slice thickness in the axial plane compared to   previous images from a study dated 04/25/2013 and to an earlier study dated   06/23/2012.    Study show some mild thickening of the wall of the distal descending   colon and some adjacent inflammatory stranding with some diverticula in   the region. The appearance could be secondary to acute diverticulitis or   mild colitis. There is no evidence of perforation or abscess formation.     There may be some mild thickening of the wall of the ascending colon and   portions of transverse colon as well. This has not been seen on previous   studies. The liver and spleen appear to enhance homogeneously. No   radiopaque gallstones are evident. The adrenal glands, pancreas and   kidneys appear to be unremarkable. The abdominal aorta is normal in   caliber. The gastric and enteric appearance is normal. The uterus is   unremarkable. There is a small amount of urine in the urinary bladder.   The lung bases show grossly normal aeration without a discrete mass,   effusion or pneumothorax.    IMPRESSION:  Findings which could represent colitis. The thickening of   the wall the colon is somewhat intermittent and not extreme. This is more   focal in the descending colon distally where there is pericolonic   inflammatory stranding. Possibility of acute diverticulitis without   perforation or abscess formation should be a consideration.  Dictation Site: 1        Verified By: Sundra Aland, M.D., MD    Assessment/Admission Diagnosis 64 y/o with hx of complicated sigmoid diverticulitis and microperf. admitted with recurrent colitis vs c diff disease from recent abx.   Plan Admit, hydrate, c diff assay, treat for now with abx for recurrent diverticulitis.  Will need colectomy low antreior resection at some point in time.  Treatment plan explained to patient and husband and all questions addressed.   Electronic  Signatures: Sherri Rad (MD)  (Signed 25-Jun-14 18:19)  Authored: CHIEF COMPLAINT and HISTORY, PAST MEDICAL/SURGIAL HISTORY, ALLERGIES, HOME MEDICATIONS, FAMILY AND SOCIAL HISTORY, REVIEW OF SYSTEMS, PHYSICAL EXAM, LABS, Radiology, ASSESSMENT AND PLAN   Last Updated: 25-Jun-14 18:19 by Sherri Rad (MD)

## 2015-05-21 ENCOUNTER — Other Ambulatory Visit: Payer: Self-pay | Admitting: Family Medicine

## 2015-05-21 DIAGNOSIS — Z1231 Encounter for screening mammogram for malignant neoplasm of breast: Secondary | ICD-10-CM

## 2015-05-31 ENCOUNTER — Ambulatory Visit
Admission: RE | Admit: 2015-05-31 | Discharge: 2015-05-31 | Disposition: A | Discharge status: Home / Self Care | Payer: TRICARE For Life (TFL) | Source: Ambulatory Visit | Attending: Family Medicine | Admitting: Family Medicine

## 2015-05-31 DIAGNOSIS — Z1231 Encounter for screening mammogram for malignant neoplasm of breast: Secondary | ICD-10-CM | POA: Diagnosis present

## 2016-02-05 ENCOUNTER — Encounter: Payer: Self-pay | Admitting: *Deleted

## 2016-02-28 ENCOUNTER — Encounter: Payer: Self-pay | Admitting: Obstetrics & Gynecology

## 2016-02-28 ENCOUNTER — Ambulatory Visit (INDEPENDENT_AMBULATORY_CARE_PROVIDER_SITE_OTHER): Payer: Medicare Other | Admitting: Obstetrics & Gynecology

## 2016-02-28 VITALS — BP 133/90 | HR 81 | Resp 16 | Ht 66.0 in | Wt 158.0 lb

## 2016-02-28 DIAGNOSIS — L292 Pruritus vulvae: Secondary | ICD-10-CM

## 2016-02-28 MED ORDER — CLOBETASOL PROPIONATE 0.05 % EX OINT: 1.0000 "application " | TOPICAL_OINTMENT | Freq: Two times a day (BID) | CUTANEOUS | Status: DC

## 2016-02-28 MED ORDER — ESTRADIOL 10 MCG VA TABS: ORAL_TABLET | VAGINAL | Status: DC

## 2016-02-28 NOTE — Progress Notes (Signed)
   Subjective:    Patient ID: Anita Hutchinson, female    DOB: 1951-09-03, 65 y.o.   MRN: [id number]  HPI 65 yo MW lady here with the complaint of vulvar itching, especially near the clitoris. She has used vagifem for years with good results but started with some itching and dyspareunia recently. Her primary care MD changed her estrogen to estrace.   Review of Systems     Objective:   Physical Exam WNWHWFNAD Normal breast exam Abd- benign EG- evidence of scratching at the clitoral area and on the top of the left labia majora No significant atrophy Bimanual exam reveals a mobile, ULN size, NT uterus and no palpable adnexal masses       Assessment & Plan:  Vulvar itching- clobetasol prescribed I changed her vulvar estrogen back to vagifem as she likes this better than "messy" estrace RTC if no better in a month

## 2016-03-11 ENCOUNTER — Telehealth: Payer: Self-pay | Admitting: *Deleted

## 2016-03-11 DIAGNOSIS — N898 Other specified noninflammatory disorders of vagina: Secondary | ICD-10-CM

## 2016-03-11 MED ORDER — FLUCONAZOLE 150 MG PO TABS: 150.0000 mg | ORAL_TABLET | Freq: Once | ORAL | Status: DC

## 2016-03-11 NOTE — Telephone Encounter (Signed)
Pt called in stating she has external vaginal itching that has improved with Estrace cream but now has internal vaginal itching. Spoke with Dr Hulan Fray who gave verbal auth to call in [place]. Pt has appt next week if still has Sx after Diflucan.

## 2016-03-19 ENCOUNTER — Encounter: Payer: Self-pay | Admitting: Obstetrics & Gynecology

## 2016-03-19 ENCOUNTER — Ambulatory Visit (INDEPENDENT_AMBULATORY_CARE_PROVIDER_SITE_OTHER): Payer: Medicare Other | Admitting: Obstetrics & Gynecology

## 2016-03-19 ENCOUNTER — Encounter: Payer: Self-pay | Admitting: *Deleted

## 2016-03-19 VITALS — BP 119/60 | HR 81 | Resp 16 | Ht 66.0 in | Wt 157.0 lb

## 2016-03-19 DIAGNOSIS — L298 Other pruritus: Secondary | ICD-10-CM | POA: Diagnosis not present

## 2016-03-19 DIAGNOSIS — N898 Other specified noninflammatory disorders of vagina: Secondary | ICD-10-CM

## 2016-03-19 MED ORDER — CLOBETASOL PROPIONATE 0.05 % EX OINT: 1.0000 "application " | TOPICAL_OINTMENT | Freq: Two times a day (BID) | CUTANEOUS | Status: DC

## 2016-03-19 NOTE — Addendum Note (Signed)
Addended by: Gretchen Short on: 03/19/2016 09:25 AM   Modules accepted: Orders

## 2016-03-19 NOTE — Progress Notes (Signed)
   Subjective:    Patient ID: Anita Hutchinson, female    DOB: 1950/12/17, 65 y.o.   MRN: [id number]  HPI 65 yo MW lady here because her vulvar discomfort/itching has returned. She says that when she first started the clobetasol and vagifem that it resolved for a week but has returned. She tried a diflucan but that didn't help.   The Astroglide did help with the dyspareunia.   Review of Systems     Objective:   Physical Exam Pleasant WNWHWFNAD Breathing, conversing, and ambulating normally Vulva- no more evidence of scratching Moderated vulvovaginal atrophy Normal vaginal discharge      Assessment & Plan:  Vulvar itching- change clobetasol from BID to 3 times per week Add Zyrtec po daily  Refer to Dr. Daniel Nones

## 2016-04-23 ENCOUNTER — Telehealth: Payer: Self-pay | Admitting: *Deleted

## 2016-04-23 ENCOUNTER — Other Ambulatory Visit: Payer: Self-pay | Admitting: Family Medicine

## 2016-04-23 DIAGNOSIS — Z1231 Encounter for screening mammogram for malignant neoplasm of breast: Secondary | ICD-10-CM

## 2016-04-23 NOTE — Telephone Encounter (Signed)
-----   Message from Francia Greaves sent at 04/22/2016  2:15 PM EDT ----- Regarding: Other Options Contact: [phone] Had a referral placed to a vulvar clinic in [place], [place] really want to travel that far, wants to know what are her other options, still having some itching

## 2016-04-23 NOTE — Telephone Encounter (Signed)
Called pt, no answer, left message to call the office.  

## 2016-06-04 ENCOUNTER — Ambulatory Visit: Admission: RE | Admit: 2016-06-04 | Payer: Medicare Other | Source: Ambulatory Visit

## 2016-06-23 ENCOUNTER — Other Ambulatory Visit: Payer: Self-pay | Admitting: Family Medicine

## 2016-06-23 ENCOUNTER — Ambulatory Visit
Admission: RE | Admit: 2016-06-23 | Discharge: 2016-06-23 | Disposition: A | Discharge status: Home / Self Care | Payer: Medicare Other | Source: Ambulatory Visit | Attending: Family Medicine | Admitting: Family Medicine

## 2016-06-23 DIAGNOSIS — Z1231 Encounter for screening mammogram for malignant neoplasm of breast: Secondary | ICD-10-CM

## 2017-04-08 ENCOUNTER — Other Ambulatory Visit: Payer: Self-pay | Admitting: Family Medicine

## 2017-04-08 DIAGNOSIS — Z1231 Encounter for screening mammogram for malignant neoplasm of breast: Secondary | ICD-10-CM

## 2017-04-30 ENCOUNTER — Other Ambulatory Visit: Payer: Self-pay | Admitting: Obstetrics & Gynecology

## 2017-07-02 ENCOUNTER — Ambulatory Visit
Admission: RE | Admit: 2017-07-02 | Discharge: 2017-07-02 | Disposition: A | Discharge status: Home / Self Care | Payer: Medicare Other | Source: Ambulatory Visit | Attending: Family Medicine | Admitting: Family Medicine

## 2017-07-02 DIAGNOSIS — Z1231 Encounter for screening mammogram for malignant neoplasm of breast: Secondary | ICD-10-CM | POA: Diagnosis not present

## 2018-06-24 ENCOUNTER — Other Ambulatory Visit: Payer: Self-pay | Admitting: Obstetrics & Gynecology

## 2018-06-28 ENCOUNTER — Other Ambulatory Visit: Payer: Self-pay | Admitting: Family Medicine

## 2018-06-28 DIAGNOSIS — Z1231 Encounter for screening mammogram for malignant neoplasm of breast: Secondary | ICD-10-CM

## 2018-07-12 ENCOUNTER — Ambulatory Visit
Admission: RE | Admit: 2018-07-12 | Discharge: 2018-07-12 | Disposition: A | Discharge status: Home / Self Care | Payer: Medicare Other | Source: Ambulatory Visit | Attending: Family Medicine | Admitting: Family Medicine

## 2018-07-12 DIAGNOSIS — Z1231 Encounter for screening mammogram for malignant neoplasm of breast: Secondary | ICD-10-CM | POA: Diagnosis not present

## 2019-03-28 ENCOUNTER — Ambulatory Visit: Admit: 2019-03-28 | Payer: Medicare Other | Admitting: Ophthalmology

## 2019-03-28 SURGERY — PHACOEMULSIFICATION, CATARACT, WITH IOL INSERTION
Anesthesia: Topical | Laterality: Right

## 2019-06-02 ENCOUNTER — Encounter: Payer: Self-pay | Admitting: *Deleted

## 2019-06-02 ENCOUNTER — Other Ambulatory Visit: Payer: Self-pay

## 2019-06-02 NOTE — Discharge Instructions (Signed)
General Anesthesia, Adult, Care After °This sheet gives you information about how to care for yourself after your procedure. Your health care provider may also give you more specific instructions. If you have problems or questions, contact your health care provider. °What can I expect after the procedure? °After the procedure, the following side effects are common: °· Pain or discomfort at the IV site. °· Nausea. °· Vomiting. °· Sore throat. °· Trouble concentrating. °· Feeling cold or chills. °· Weak or tired. °· Sleepiness and fatigue. °· Soreness and body aches. These side effects can affect parts of the body that were not involved in surgery. °Follow these instructions at home: ° °For at least 24 hours after the procedure: °· Have a responsible adult stay with you. It is important to have someone help care for you until you are awake and alert. °· Rest as needed. °· Do not: °? Participate in activities in which you could fall or become injured. °? Drive. °? Use heavy machinery. °? Drink alcohol. °? Take sleeping pills or medicines that cause drowsiness. °? Make important decisions or sign legal documents. °? Take care of children on your own. °Eating and drinking °· Follow any instructions from your health care provider about eating or drinking restrictions. °· When you feel hungry, start by eating small amounts of foods that are soft and easy to digest (bland), such as toast. Gradually return to your regular diet. °· Drink enough fluid to keep your urine pale yellow. °· If you vomit, rehydrate by drinking water, juice, or clear broth. °General instructions °· If you have sleep apnea, surgery and certain medicines can increase your risk for breathing problems. Follow instructions from your health care provider about wearing your sleep device: °? Anytime you are sleeping, including during daytime naps. °? While taking prescription pain medicines, sleeping medicines, or medicines that make you drowsy. °· Return to  your normal activities as told by your health care provider. Ask your health care provider what activities are safe for you. °· Take over-the-counter and prescription medicines only as told by your health care provider. °· If you smoke, do not smoke without supervision. °· Keep all follow-up visits as told by your health care provider. This is important. °Contact a health care provider if: °· You have nausea or vomiting that does not get better with medicine. °· You cannot eat or drink without vomiting. °· You have pain that does not get better with medicine. °· You are unable to pass urine. °· You develop a skin rash. °· You have a fever. °· You have redness around your IV site that gets worse. °Get help right away if: °· You have difficulty breathing. °· You have chest pain. °· You have blood in your urine or stool, or you vomit blood. °Summary °· After the procedure, it is common to have a sore throat or nausea. It is also common to feel tired. °· Have a responsible adult stay with you for the first 24 hours after general anesthesia. It is important to have someone help care for you until you are awake and alert. °· When you feel hungry, start by eating small amounts of foods that are soft and easy to digest (bland), such as toast. Gradually return to your regular diet. °· Drink enough fluid to keep your urine pale yellow. °· Return to your normal activities as told by your health care provider. Ask your health care provider what activities are safe for you. °This information is not   intended to replace advice given to you by your health care provider. Make sure you discuss any questions you have with your health care provider. °Document Released: 02/16/2001 Document Revised: 11/13/2017 Document Reviewed: 06/26/2017 °Elsevier Patient Education © 2020 [employer] Inc[employer] °Cataract Surgery, Care After °This sheet gives you information about how to care for yourself after your procedure. Your health care provider may also  give you more specific instructions. If you have problems or questions, contact your health care provider. °What can I expect after the procedure? °After the procedure, it is common to have: °· Itching. °· Discomfort. °· Fluid discharge. °· Sensitivity to light and to touch. °· Bruising in or around the eye. °· Mild blurred vision. °Follow these instructions at home: °Eye care ° °· Do not touch or rub your eyes. °· Protect your eyes as told by your health care provider. You may be told to wear a protective eye shield or sunglasses. °· Do not put a contact lens into the affected eye or eyes until your health care provider approves. °· Keep the area around your eye clean and dry: °? Avoid swimming. °? Do not allow water to hit you directly in the face while showering. °? Keep soap and shampoo out of your eyes. °· Check your eye every day for signs of infection. Watch for: °? Redness, swelling, or pain. °? Fluid, blood, or pus. °? Warmth. °? A bad smell. °? Vision that is getting worse. °? Sensitivity that is getting worse. °Activity °· Do not drive for 24 hours if you were given a sedative during your procedure. °· Avoid strenuous activities, such as playing contact sports, for as long as told by your health care provider. °· Do not drive or use heavy machinery until your health care provider approves. °· Do not bend or lift heavy objects. Bending increases pressure in the eye. You can walk, climb stairs, and do light household chores. °· Ask your health care provider when you can return to work. If you work in a dusty environment, you may be advised to wear protective eyewear for a period of time. °General instructions °· Take or apply over-the-counter and prescription medicines only as told by your health care provider. This includes eye drops. °· Keep all follow-up visits as told by your health care provider. This is important. °Contact a health care provider if: °· You have increased bruising around your  eye. °· You have pain that is not helped with medicine. °· You have a fever. °· You have redness, swelling, or pain in your eye. °· You have fluid, blood, or pus coming from your incision. °· Your vision gets worse. °· Your sensitivity to light gets worse. °Get help right away if: °· You have sudden loss of vision. °· You see flashes of light or spots (floaters). °· You have severe eye pain. °· You develop nausea or vomiting. °Summary °· After your procedure, it is common to have itching, discomfort, bruising, fluid discharge, or sensitivity to light. °· Follow instructions from your health care provider about caring for your eye after the procedure. °· Do not rub your eye after the procedure. You may need to wear eye protection or sunglasses. Do not wear contact lenses. Keep the area around your eye clean and dry. °· Avoid activities that require a lot of effort. These include playing sports and lifting heavy objects. °· Contact a health care provider if you have increased bruising, pain that does not go away, or a fever. Get   help right away if you suddenly lose your vision, see flashes of light or spots, or have severe pain in the eye. °This information is not intended to replace advice given to you by your health care provider. Make sure you discuss any questions you have with your health care provider. °Document Released: 05/30/2005 Document Revised: 05/10/2018 Document Reviewed: 05/10/2018 °Elsevier Patient Education © 2020 Elsevier Inc. ° °

## 2019-06-07 ENCOUNTER — Other Ambulatory Visit: Payer: Self-pay

## 2019-06-07 ENCOUNTER — Other Ambulatory Visit
Admission: RE | Admit: 2019-06-07 | Discharge: 2019-06-07 | Disposition: A | Discharge status: Home / Self Care | Payer: Medicare Other | Source: Ambulatory Visit | Attending: Ophthalmology | Admitting: Ophthalmology

## 2019-06-07 DIAGNOSIS — Z1159 Encounter for screening for other viral diseases: Secondary | ICD-10-CM | POA: Insufficient documentation

## 2019-06-08 LAB — SARS CORONAVIRUS 2 (TAT 6-24 HRS): SARS Coronavirus 2: NEGATIVE

## 2019-06-10 ENCOUNTER — Ambulatory Visit
Admission: RE | Admit: 2019-06-10 | Discharge: 2019-06-10 | Disposition: A | Discharge status: Home / Self Care | Payer: Medicare Other | Attending: Ophthalmology | Admitting: Ophthalmology

## 2019-06-10 ENCOUNTER — Ambulatory Visit: Payer: Medicare Other | Admitting: Anesthesiology

## 2019-06-10 ENCOUNTER — Encounter
Admission: RE | Disposition: A | Discharge status: Home / Self Care | Payer: Self-pay | Source: Home / Self Care | Attending: Ophthalmology

## 2019-06-10 DIAGNOSIS — H2511 Age-related nuclear cataract, right eye: Secondary | ICD-10-CM | POA: Diagnosis not present

## 2019-06-10 DIAGNOSIS — Z87891 Personal history of nicotine dependence: Secondary | ICD-10-CM | POA: Insufficient documentation

## 2019-06-10 HISTORY — DX: Presence of dental prosthetic device (complete) (partial): Z97.2

## 2019-06-10 HISTORY — PX: CATARACT EXTRACTION W/PHACO: SHX586

## 2019-06-10 SURGERY — PHACOEMULSIFICATION, CATARACT, WITH IOL INSERTION
Anesthesia: Monitor Anesthesia Care | Site: Eye | Laterality: Right

## 2019-06-10 MED ORDER — MOXIFLOXACIN HCL 0.5 % OP SOLN
OPHTHALMIC | Status: DC | PRN
  Administered 2019-06-10: 0.2 mL via OPHTHALMIC

## 2019-06-10 MED ORDER — ARMC OPHTHALMIC DILATING DROPS
1.0000 "application " | OPHTHALMIC | Status: DC | PRN
  Administered 2019-06-10 (×2): 1 via OPHTHALMIC

## 2019-06-10 MED ORDER — SODIUM HYALURONATE 10 MG/ML IO SOLN
INTRAOCULAR | Status: DC | PRN
  Administered 2019-06-10: 0.55 mL via INTRAOCULAR

## 2019-06-10 MED ORDER — EPINEPHRINE PF 1 MG/ML IJ SOLN
INTRAOCULAR | Status: DC | PRN
  Administered 2019-06-10: 09:00:00 68 mL via OPHTHALMIC

## 2019-06-10 MED ORDER — SODIUM HYALURONATE 23 MG/ML IO SOLN
INTRAOCULAR | Status: DC | PRN
  Administered 2019-06-10: 0.6 mL via INTRAOCULAR

## 2019-06-10 MED ORDER — TETRACAINE HCL 0.5 % OP SOLN
1.0000 [drp] | OPHTHALMIC | Status: DC | PRN
Start: 2019-06-10 — End: 2019-06-10
  Administered 2019-06-10 (×3): 1 [drp] via OPHTHALMIC

## 2019-06-10 MED ORDER — FENTANYL CITRATE (PF) 100 MCG/2ML IJ SOLN
INTRAMUSCULAR | Status: DC | PRN
  Administered 2019-06-10: 100 ug via INTRAVENOUS

## 2019-06-10 MED ORDER — LIDOCAINE HCL (PF) 2 % IJ SOLN
INTRAOCULAR | Status: DC | PRN
  Administered 2019-06-10: 09:00:00 1 mL via INTRAOCULAR

## 2019-06-10 MED ORDER — MIDAZOLAM HCL 2 MG/2ML IJ SOLN
INTRAMUSCULAR | Status: DC | PRN
  Administered 2019-06-10: 2 mg via INTRAVENOUS

## 2019-06-10 SURGICAL SUPPLY — 19 items
CANNULA ANT/CHMB 27G (MISCELLANEOUS) ×2 IMPLANT
CANNULA ANT/CHMB 27GA (MISCELLANEOUS) ×6 IMPLANT
DISSECTOR HYDRO NUCLEUS 50X22 (MISCELLANEOUS) ×3 IMPLANT
GLOVE SURG LX 7.5 STRW (GLOVE) ×2
GLOVE SURG LX STRL 7.5 STRW (GLOVE) ×1 IMPLANT
GLOVE SURG SYN 8.5  E (GLOVE) ×2
GLOVE SURG SYN 8.5 E (GLOVE) ×1 IMPLANT
GLOVE SURG SYN 8.5 PF PI (GLOVE) ×1 IMPLANT
GOWN STRL REUS W/ TWL LRG LVL3 (GOWN DISPOSABLE) ×2 IMPLANT
GOWN STRL REUS W/TWL LRG LVL3 (GOWN DISPOSABLE) ×6
LENS IOL TECNIS ITEC 21.0 (Intraocular Lens) ×2 IMPLANT
MARKER SKIN DUAL TIP RULER LAB (MISCELLANEOUS) ×3 IMPLANT
PACK DR. KING ARMS (PACKS) ×3 IMPLANT
PACK EYE AFTER SURG (MISCELLANEOUS) ×3 IMPLANT
PACK OPTHALMIC (MISCELLANEOUS) ×3 IMPLANT
SYR 3ML LL SCALE MARK (SYRINGE) ×3 IMPLANT
SYR TB 1ML LUER SLIP (SYRINGE) ×3 IMPLANT
WATER STERILE IRR 250ML POUR (IV SOLUTION) ×3 IMPLANT
WIPE NON LINTING 3.25X3.25 (MISCELLANEOUS) ×3 IMPLANT

## 2019-06-10 NOTE — Op Note (Signed)
OPERATIVE NOTE  Anita Hutchinson [id number] 06/10/2019   PREOPERATIVE DIAGNOSIS:  Nuclear sclerotic cataract right eye.  H25.11   POSTOPERATIVE DIAGNOSIS:    Nuclear sclerotic cataract right eye.     PROCEDURE:  Phacoemusification with posterior chamber intraocular lens placement of the right eye   LENS:   Implant Name Type Inv. Item Serial No. Manufacturer Lot No. LRB No. Used Action  LENS IOL DIOP 21.0 - [id number] Intraocular Lens LENS IOL DIOP 21.0 [id number] AMO  Right 1 Implanted       PCB00 +21.0   ULTRASOUND TIME: 0 minutes 44 seconds.  CDE 2.77   SURGEON:  Benay Pillow, MD, MPH  ANESTHESIOLOGIST: Anesthesiologist: Rochel Brome, MD CRNA: Mayme Genta, CRNA   ANESTHESIA:  Topical with tetracaine drops augmented with 1% preservative-free intracameral lidocaine.  ESTIMATED BLOOD LOSS: less than 1 mL.   COMPLICATIONS:  None.   DESCRIPTION OF PROCEDURE:  The patient was identified in the holding room and transported to the operating room and placed in the supine position under the operating microscope.  The right eye was identified as the operative eye and it was prepped and draped in the usual sterile ophthalmic fashion.   A 1.0 millimeter clear-corneal paracentesis was made at the 10:30 position. 0.5 ml of preservative-free 1% lidocaine with epinephrine was injected into the anterior chamber.  The anterior chamber was filled with Healon 5 viscoelastic.  A 2.4 millimeter keratome was used to make a near-clear corneal incision at the 8:00 position.  A curvilinear capsulorrhexis was made with a cystotome and capsulorrhexis forceps.  Balanced salt solution was used to hydrodissect and hydrodelineate the nucleus.   Phacoemulsification was then used in stop and chop fashion to remove the lens nucleus and epinucleus.  The remaining cortex was then removed using the irrigation and aspiration handpiece. Healon was then placed into the capsular bag to distend it for lens placement.  A  lens was then injected into the capsular bag.  The remaining viscoelastic was aspirated.   Wounds were hydrated with balanced salt solution.  The anterior chamber was inflated to a physiologic pressure with balanced salt solution.   Intracameral vigamox 0.1 mL undiluted was injected into the eye and a drop placed onto the ocular surface.  No wound leaks were noted.  The patient was taken to the recovery room in stable condition without complications of anesthesia or surgery  Benay Pillow 06/10/2019, 9:15 AM

## 2019-06-10 NOTE — Anesthesia Procedure Notes (Signed)
Procedure Name: MAC Performed by: Latarra Eagleton, CRNA Pre-anesthesia Checklist: Patient identified, Emergency Drugs available, Suction available, Timeout performed and Patient being monitored Patient Re-evaluated:Patient Re-evaluated prior to induction Oxygen Delivery Method: Nasal cannula Placement Confirmation: positive ETCO2       

## 2019-06-10 NOTE — Anesthesia Postprocedure Evaluation (Signed)
Anesthesia Post Note  Patient: Anita Hutchinson  Procedure(s) Performed: CATARACT EXTRACTION PHACO AND INTRAOCULAR LENS PLACEMENT (IOC)  RIGHT (Right Eye)  Patient location during evaluation: PACU Anesthesia Type: MAC Level of consciousness: awake and alert Pain management: pain level controlled Vital Signs Assessment: post-procedure vital signs reviewed and stable Respiratory status: spontaneous breathing, nonlabored ventilation, respiratory function stable and patient connected to nasal cannula oxygen Cardiovascular status: stable and blood pressure returned to baseline Postop Assessment: no apparent nausea or vomiting Anesthetic complications: no    Trecia Rogers

## 2019-06-10 NOTE — Transfer of Care (Signed)
Immediate Anesthesia Transfer of Care Note  Patient: Anita Hutchinson  Procedure(s) Performed: CATARACT EXTRACTION PHACO AND INTRAOCULAR LENS PLACEMENT (IOC)  RIGHT (Right Eye)  Patient Location: PACU  Anesthesia Type: MAC  Level of Consciousness: awake, alert  and patient cooperative  Airway and Oxygen Therapy: Patient Spontanous Breathing and Patient connected to supplemental oxygen  Post-op Assessment: Post-op Vital signs reviewed, Patient's Cardiovascular Status Stable, Respiratory Function Stable, Patent Airway and No signs of Nausea or vomiting  Post-op Vital Signs: Reviewed and stable  Complications: No apparent anesthesia complications

## 2019-06-10 NOTE — Anesthesia Preprocedure Evaluation (Signed)
Anesthesia Evaluation  Patient identified by MRN, date of birth, ID band Patient awake    Reviewed: Allergy & Precautions, H&P , NPO status , Patient's Chart, lab work & pertinent test results, reviewed documented beta blocker date and time   Airway Mallampati: II  TM Distance: >3 FB Neck ROM: full    Dental no notable dental hx. (+) Implants, Caps   Pulmonary neg pulmonary ROS,    Pulmonary exam normal breath sounds clear to auscultation       Cardiovascular Exercise Tolerance: Good hypertension, Normal cardiovascular exam Rhythm:regular Rate:Normal     Neuro/Psych negative neurological ROS  negative psych ROS   GI/Hepatic negative GI ROS, Neg liver ROS,   Endo/Other  negative endocrine ROS  Renal/GU negative Renal ROS  negative genitourinary   Musculoskeletal   Abdominal   Peds  Hematology negative hematology ROS (+)   Anesthesia Other Findings   Reproductive/Obstetrics negative OB ROS                             Anesthesia Physical Anesthesia Plan  ASA: II  Anesthesia Plan: MAC   Post-op Pain Management:    Induction:   PONV Risk Score and Plan:   Airway Management Planned:   Additional Equipment:   Intra-op Plan:   Post-operative Plan:   Informed Consent: I have reviewed the patients History and Physical, chart, labs and discussed the procedure including the risks, benefits and alternatives for the proposed anesthesia with the patient or authorized representative who has indicated his/her understanding and acceptance.     Dental Advisory Given  Plan Discussed with: CRNA  Anesthesia Plan Comments:         Anesthesia Quick Evaluation

## 2019-06-10 NOTE — H&P (Signed)

## 2019-06-23 ENCOUNTER — Other Ambulatory Visit: Payer: Self-pay

## 2019-06-23 ENCOUNTER — Encounter: Payer: Self-pay | Admitting: *Deleted

## 2019-06-24 ENCOUNTER — Encounter: Payer: Self-pay | Admitting: Anesthesiology

## 2019-06-29 NOTE — Discharge Instructions (Signed)

## 2019-06-30 ENCOUNTER — Other Ambulatory Visit: Payer: Self-pay

## 2019-06-30 ENCOUNTER — Other Ambulatory Visit
Admission: RE | Admit: 2019-06-30 | Discharge: 2019-06-30 | Disposition: A | Discharge status: Home / Self Care | Payer: Medicare Other | Source: Ambulatory Visit | Attending: Ophthalmology | Admitting: Ophthalmology

## 2019-06-30 DIAGNOSIS — Z01812 Encounter for preprocedural laboratory examination: Secondary | ICD-10-CM | POA: Diagnosis present

## 2019-06-30 DIAGNOSIS — Z20828 Contact with and (suspected) exposure to other viral communicable diseases: Secondary | ICD-10-CM | POA: Diagnosis not present

## 2019-06-30 LAB — SARS CORONAVIRUS 2 (TAT 6-24 HRS): SARS Coronavirus 2: NEGATIVE

## 2019-07-04 ENCOUNTER — Ambulatory Visit: Payer: Medicare Other | Admitting: Anesthesiology

## 2019-07-04 ENCOUNTER — Other Ambulatory Visit: Payer: Self-pay

## 2019-07-04 ENCOUNTER — Encounter
Admission: RE | Disposition: A | Discharge status: Home / Self Care | Payer: Self-pay | Source: Home / Self Care | Attending: Ophthalmology

## 2019-07-04 ENCOUNTER — Ambulatory Visit
Admission: RE | Admit: 2019-07-04 | Discharge: 2019-07-04 | Disposition: A | Discharge status: Home / Self Care | Payer: Medicare Other | Attending: Ophthalmology | Admitting: Ophthalmology

## 2019-07-04 DIAGNOSIS — Z880 Allergy status to penicillin: Secondary | ICD-10-CM | POA: Insufficient documentation

## 2019-07-04 DIAGNOSIS — H2512 Age-related nuclear cataract, left eye: Secondary | ICD-10-CM | POA: Insufficient documentation

## 2019-07-04 DIAGNOSIS — Z886 Allergy status to analgesic agent status: Secondary | ICD-10-CM | POA: Insufficient documentation

## 2019-07-04 DIAGNOSIS — Z881 Allergy status to other antibiotic agents status: Secondary | ICD-10-CM | POA: Insufficient documentation

## 2019-07-04 DIAGNOSIS — Z888 Allergy status to other drugs, medicaments and biological substances status: Secondary | ICD-10-CM | POA: Diagnosis not present

## 2019-07-04 HISTORY — PX: CATARACT EXTRACTION W/PHACO: SHX586

## 2019-07-04 SURGERY — PHACOEMULSIFICATION, CATARACT, WITH IOL INSERTION
Anesthesia: Monitor Anesthesia Care | Site: Eye | Laterality: Left

## 2019-07-04 MED ORDER — MOXIFLOXACIN HCL 0.5 % OP SOLN
OPHTHALMIC | Status: DC | PRN
  Administered 2019-07-04: 0.2 mL via OPHTHALMIC

## 2019-07-04 MED ORDER — SODIUM HYALURONATE 10 MG/ML IO SOLN
INTRAOCULAR | Status: DC | PRN
  Administered 2019-07-04: 0.55 mL via INTRAOCULAR

## 2019-07-04 MED ORDER — ACETAMINOPHEN 160 MG/5ML PO SOLN: 325.0000 mg | ORAL | Status: DC | PRN

## 2019-07-04 MED ORDER — LIDOCAINE HCL (PF) 2 % IJ SOLN
INTRAOCULAR | Status: DC | PRN
  Administered 2019-07-04: 1 mL via INTRAOCULAR

## 2019-07-04 MED ORDER — SODIUM HYALURONATE 23 MG/ML IO SOLN
INTRAOCULAR | Status: DC | PRN
  Administered 2019-07-04: 0.6 mL via INTRAOCULAR

## 2019-07-04 MED ORDER — MIDAZOLAM HCL 2 MG/2ML IJ SOLN
INTRAMUSCULAR | Status: DC | PRN
  Administered 2019-07-04: 2 mg via INTRAVENOUS

## 2019-07-04 MED ORDER — ACETAMINOPHEN 325 MG PO TABS: 325.0000 mg | ORAL_TABLET | ORAL | Status: DC | PRN

## 2019-07-04 MED ORDER — EPINEPHRINE PF 1 MG/ML IJ SOLN
INTRAOCULAR | Status: DC | PRN
  Administered 2019-07-04: 65 mL via OPHTHALMIC

## 2019-07-04 MED ORDER — FENTANYL CITRATE (PF) 100 MCG/2ML IJ SOLN
INTRAMUSCULAR | Status: DC | PRN
  Administered 2019-07-04: 100 ug via INTRAVENOUS

## 2019-07-04 MED ORDER — ARMC OPHTHALMIC DILATING DROPS
1.0000 "application " | OPHTHALMIC | Status: DC | PRN
  Administered 2019-07-04 (×3): 1 via OPHTHALMIC

## 2019-07-04 MED ORDER — TETRACAINE HCL 0.5 % OP SOLN
1.0000 [drp] | OPHTHALMIC | Status: DC | PRN
  Administered 2019-07-04 (×3): 1 [drp] via OPHTHALMIC

## 2019-07-04 SURGICAL SUPPLY — 19 items
CANNULA ANT/CHMB 27G (MISCELLANEOUS) ×2 IMPLANT
CANNULA ANT/CHMB 27GA (MISCELLANEOUS) ×6 IMPLANT
DISSECTOR HYDRO NUCLEUS 50X22 (MISCELLANEOUS) ×3 IMPLANT
GLOVE SURG LX 7.5 STRW (GLOVE) ×4
GLOVE SURG LX STRL 7.5 STRW (GLOVE) ×1 IMPLANT
GLOVE SURG SYN 8.5  E (GLOVE) ×2
GLOVE SURG SYN 8.5 E (GLOVE) ×1 IMPLANT
GLOVE SURG SYN 8.5 PF PI (GLOVE) ×1 IMPLANT
GOWN STRL REUS W/ TWL LRG LVL3 (GOWN DISPOSABLE) ×2 IMPLANT
GOWN STRL REUS W/TWL LRG LVL3 (GOWN DISPOSABLE) ×6
LENS IOL TECNIS ITEC 21.0 (Intraocular Lens) ×2 IMPLANT
MARKER SKIN DUAL TIP RULER LAB (MISCELLANEOUS) ×3 IMPLANT
PACK DR. KING ARMS (PACKS) ×3 IMPLANT
PACK EYE AFTER SURG (MISCELLANEOUS) ×3 IMPLANT
PACK OPTHALMIC (MISCELLANEOUS) ×3 IMPLANT
SYR 3ML LL SCALE MARK (SYRINGE) ×3 IMPLANT
SYR TB 1ML LUER SLIP (SYRINGE) ×3 IMPLANT
WATER STERILE IRR 250ML POUR (IV SOLUTION) ×3 IMPLANT
WIPE NON LINTING 3.25X3.25 (MISCELLANEOUS) ×3 IMPLANT

## 2019-07-04 NOTE — Transfer of Care (Signed)
Immediate Anesthesia Transfer of Care Note  Patient: Anita Hutchinson  Procedure(s) Performed: CATARACT EXTRACTION PHACO AND INTRAOCULAR LENS PLACEMENT (IOC) LEFT (Left Eye)  Patient Location: PACU  Anesthesia Type: MAC  Level of Consciousness: awake, alert  and patient cooperative  Airway and Oxygen Therapy: Patient Spontanous Breathing and Patient connected to supplemental oxygen  Post-op Assessment: Post-op Vital signs reviewed, Patient's Cardiovascular Status Stable, Respiratory Function Stable, Patent Airway and No signs of Nausea or vomiting  Post-op Vital Signs: Reviewed and stable  Complications: No apparent anesthesia complications

## 2019-07-04 NOTE — H&P (Signed)

## 2019-07-04 NOTE — Anesthesia Preprocedure Evaluation (Signed)
Anesthesia Evaluation  Patient identified by MRN, date of birth, ID band Patient awake    Airway Mallampati: I  TM Distance: >3 FB Neck ROM: Full    Dental no notable dental hx.    Pulmonary neg pulmonary ROS,    Pulmonary exam normal        Cardiovascular Exercise Tolerance: Good negative cardio ROS Normal cardiovascular exam     Neuro/Psych negative neurological ROS  negative psych ROS   GI/Hepatic negative GI ROS, Neg liver ROS,   Endo/Other  negative endocrine ROS  Renal/GU negative Renal ROS     Musculoskeletal negative musculoskeletal ROS (+)   Abdominal   Peds  Hematology negative hematology ROS (+)   Anesthesia Other Findings   Reproductive/Obstetrics                            Anesthesia Physical Anesthesia Plan  ASA: I  Anesthesia Plan: MAC   Post-op Pain Management:    Induction: Intravenous  PONV Risk Score and Plan: 2 and Midazolam  Airway Management Planned: Nasal Cannula  Additional Equipment: None  Intra-op Plan:   Post-operative Plan:   Informed Consent: I have reviewed the patients History and Physical, chart, labs and discussed the procedure including the risks, benefits and alternatives for the proposed anesthesia with the patient or authorized representative who has indicated his/her understanding and acceptance.       Plan Discussed with:   Anesthesia Plan Comments:         Anesthesia Quick Evaluation

## 2019-07-04 NOTE — Anesthesia Postprocedure Evaluation (Signed)
Anesthesia Post Note  Patient: Anita Hutchinson  Procedure(s) Performed: CATARACT EXTRACTION PHACO AND INTRAOCULAR LENS PLACEMENT (IOC) LEFT (Left Eye)  Patient location during evaluation: PACU Anesthesia Type: MAC Level of consciousness: awake and alert Pain management: pain level controlled Vital Signs Assessment: post-procedure vital signs reviewed and stable Respiratory status: spontaneous breathing Cardiovascular status: blood pressure returned to baseline Postop Assessment: no apparent nausea or vomiting, adequate PO intake and no headache Anesthetic complications: no    Adele Barthel Keli Buehner

## 2019-07-04 NOTE — Op Note (Signed)
OPERATIVE NOTE  Anita Hutchinson [id number] 07/04/2019   PREOPERATIVE DIAGNOSIS:  Nuclear sclerotic cataract left eye.  H25.12   POSTOPERATIVE DIAGNOSIS:    Nuclear sclerotic cataract left eye.     PROCEDURE:  Phacoemusification with posterior chamber intraocular lens placement of the left eye   LENS:   Implant Name Type Inv. Item Serial No. Manufacturer Lot No. LRB No. Used Action  LENS IOL DIOP 21.0 - [id number] Intraocular Lens LENS IOL DIOP 21.0 [id number] AMO  Left 1 Implanted       PCB00 +21.0   ULTRASOUND TIME: 0 minutes 42 seconds.  CDE 5.65   SURGEON:  Benay Pillow, MD, MPH   ANESTHESIA:  Topical with tetracaine drops augmented with 1% preservative-free intracameral lidocaine.  ESTIMATED BLOOD LOSS: <1 mL   COMPLICATIONS:  None.   DESCRIPTION OF PROCEDURE:  The patient was identified in the holding room and transported to the operating room and placed in the supine position under the operating microscope.  The left eye was identified as the operative eye and it was prepped and draped in the usual sterile ophthalmic fashion.   A 1.0 millimeter clear-corneal paracentesis was made at the 5:00 position. 0.5 ml of preservative-free 1% lidocaine with epinephrine was injected into the anterior chamber.  The anterior chamber was filled with Healon 5 viscoelastic.  A 2.4 millimeter keratome was used to make a near-clear corneal incision at the 2:00 position.  A curvilinear capsulorrhexis was made with a cystotome and capsulorrhexis forceps.  Balanced salt solution was used to hydrodissect and hydrodelineate the nucleus.   Phacoemulsification was then used in stop and chop fashion to remove the lens nucleus and epinucleus.  The remaining cortex was then removed using the irrigation and aspiration handpiece. Healon was then placed into the capsular bag to distend it for lens placement.  A lens was then injected into the capsular bag.  The remaining viscoelastic was aspirated.   Wounds  were hydrated with balanced salt solution.  The anterior chamber was inflated to a physiologic pressure with balanced salt solution.  Intracameral vigamox 0.1 mL undiltued was injected into the eye and a drop placed onto the ocular surface.  No wound leaks were noted.  The patient was taken to the recovery room in stable condition without complications of anesthesia or surgery  Benay Pillow 07/04/2019, 8:42 AM

## 2019-07-05 ENCOUNTER — Encounter: Payer: Self-pay | Admitting: Ophthalmology

## 2019-08-18 ENCOUNTER — Other Ambulatory Visit: Payer: Self-pay | Admitting: Obstetrics & Gynecology

## 2019-10-06 ENCOUNTER — Other Ambulatory Visit: Payer: Self-pay | Admitting: Family Medicine

## 2019-10-06 DIAGNOSIS — Z1231 Encounter for screening mammogram for malignant neoplasm of breast: Secondary | ICD-10-CM

## 2020-03-05 ENCOUNTER — Ambulatory Visit
Admission: RE | Admit: 2020-03-05 | Discharge: 2020-03-05 | Disposition: A | Discharge status: Home / Self Care | Payer: Medicare Other | Source: Ambulatory Visit | Attending: Family Medicine | Admitting: Family Medicine

## 2020-03-05 DIAGNOSIS — Z1231 Encounter for screening mammogram for malignant neoplasm of breast: Secondary | ICD-10-CM | POA: Diagnosis present

## 2021-04-16 ENCOUNTER — Other Ambulatory Visit: Payer: Self-pay

## 2021-04-16 ENCOUNTER — Emergency Department: Payer: Medicare Other

## 2021-04-16 ENCOUNTER — Emergency Department
Admission: EM | Admit: 2021-04-16 | Discharge: 2021-04-16 | Disposition: A | Discharge status: Home / Self Care | Payer: Medicare Other | Attending: Emergency Medicine | Admitting: Emergency Medicine

## 2021-04-16 DIAGNOSIS — R1030 Lower abdominal pain, unspecified: Secondary | ICD-10-CM

## 2021-04-16 DIAGNOSIS — Z8719 Personal history of other diseases of the digestive system: Secondary | ICD-10-CM | POA: Insufficient documentation

## 2021-04-16 DIAGNOSIS — R1310 Dysphagia, unspecified: Secondary | ICD-10-CM | POA: Insufficient documentation

## 2021-04-16 DIAGNOSIS — I10 Essential (primary) hypertension: Secondary | ICD-10-CM | POA: Insufficient documentation

## 2021-04-16 DIAGNOSIS — R1032 Left lower quadrant pain: Secondary | ICD-10-CM | POA: Insufficient documentation

## 2021-04-16 DIAGNOSIS — Z79899 Other long term (current) drug therapy: Secondary | ICD-10-CM | POA: Insufficient documentation

## 2021-04-16 DIAGNOSIS — K5720 Diverticulitis of large intestine with perforation and abscess without bleeding: Secondary | ICD-10-CM

## 2021-04-16 LAB — COMPREHENSIVE METABOLIC PANEL
ALT: 26 U/L (ref 0–44)
AST: 24 U/L (ref 15–41)
Albumin: 4.3 g/dL (ref 3.5–5.0)
Alkaline Phosphatase: 101 U/L (ref 38–126)
Anion gap: 11 (ref 5–15)
BUN: 14 mg/dL (ref 8–23)
CO2: 25 mmol/L (ref 22–32)
Calcium: 9.3 mg/dL (ref 8.9–10.3)
Chloride: 102 mmol/L (ref 98–111)
Creatinine, Ser: 0.65 mg/dL (ref 0.44–1.00)
GFR, Estimated: >60 mL/min (ref >60)
Glucose, Bld: 101 mg/dL — ABNORMAL HIGH (ref 70–99)
Potassium: 4.1 mmol/L (ref 3.5–5.1)
Sodium: 138 mmol/L (ref 135–145)
Total Bilirubin: 1 mg/dL (ref 0.3–1.2)
Total Protein: 7.6 g/dL (ref 6.5–8.1)

## 2021-04-16 LAB — CBC
HCT: 43.2 % (ref 36.0–46.0)
Hemoglobin: 14.9 g/dL (ref 12.0–15.0)
MCH: 32.2 pg (ref 26.0–34.0)
MCHC: 34.5 g/dL (ref 30.0–36.0)
MCV: 93.3 fL (ref 80.0–100.0)
Platelets: 230 10*3/uL (ref 150–400)
RBC: 4.63 MIL/uL (ref 3.87–5.11)
RDW: 13.1 % (ref 11.5–15.5)
WBC: 7.1 10*3/uL (ref 4.0–10.5)
nRBC: 0 % (ref 0.0–0.2)

## 2021-04-16 LAB — TROPONIN I (HIGH SENSITIVITY)
Troponin I (High Sensitivity): 4 ng/L (ref <18)
Troponin I (High Sensitivity): 5 ng/L (ref <18)

## 2021-04-16 LAB — LIPASE, BLOOD: Lipase: 26 U/L (ref 11–51)

## 2021-04-16 MED ORDER — FAMOTIDINE 20 MG PO TABS
40.0000 mg | ORAL_TABLET | Freq: Once | ORAL | Status: AC
  Administered 2021-04-16: 40 mg via ORAL
  Filled 2021-04-16: qty 2

## 2021-04-16 MED ORDER — ALUM & MAG HYDROXIDE-SIMETH 200-200-20 MG/5ML PO SUSP
30.0000 mL | Freq: Once | ORAL | Status: AC
  Administered 2021-04-16: 30 mL via ORAL
  Filled 2021-04-16: qty 30

## 2021-04-16 MED ORDER — SUCRALFATE 1 G PO TABS: 1.0000 g | ORAL_TABLET | Freq: Four times a day (QID) | ORAL | 1 refills | Status: DC

## 2021-04-16 MED ORDER — FAMOTIDINE 20 MG PO TABS: 20.0000 mg | ORAL_TABLET | Freq: Two times a day (BID) | ORAL | 0 refills | Status: DC

## 2021-04-16 NOTE — ED Provider Notes (Signed)
Emergency department handoff note  Care of this patient was signed out to me at the end of the previous provider shift.  All pertinent patient information was conveyed and all questions were answered.  Patient was pending a CT of the abdomen pelvis that showed mild early diverticulitis.  Given the patient has been on an antibiotic to treat this diverticulitis before as well as mild abdominal pain and no leukocytosis, recommend patient's discuss with the gastroenterologist tomorrow what treatment she would like to pursue for this mild diverticulitis.  The patient has been reexamined and is ready to be discharged.  All diagnostic results have been reviewed and discussed with the patient/family.  Care plan has been outlined and the patient/family understands all current diagnoses, results, and treatment plans.  There are no new complaints, changes, or physical findings at this time.  All questions have been addressed and answered.  Patient was instructed to, and agrees to follow-up with their primary care physician as well as return to the emergency department if any new or worsening symptoms develop.   Naaman Plummer, MD 04/16/21 [phone]

## 2021-04-16 NOTE — ED Triage Notes (Signed)
Pt come with c/o abdominal pain, dizziness and diarrhea. Pt states hx of diverticulitis. Pt states she went to [facility] and they performed EKG and advised her to come here.   Pt denies nay CP or SOb.

## 2021-04-16 NOTE — ED Provider Notes (Signed)
[facility] Emergency Department Provider Note  ____________________________________________  Time seen: Approximately 3:08 PM  I have reviewed the triage vital signs and the nursing notes.   HISTORY  Chief Complaint Abdominal Pain    HPI Anita Hutchinson is a 70 y.o. female with a history of diverticulitis and hypertension who comes ED complaining of diffuse lower abdominal tenderness and loose bowel movements for the past few days.  No chest pain shortness of breath fevers chills or sweats.  No dizziness or syncope.  Was recently treated for an episode of diverticulitis about a month ago.  No black or bloody stool.  Pain is moderate intensity, nonradiating, worse with movement.  Also complains of difficulty with swallowing, feeling like intermittently cold liquids get stuck in her throat and she can feel it going down her esophagus.  There is momentary discomfort which passes after a few seconds.  No trouble eating solids.  No history of esophageal stricture or heavy drinking or GERD.      Past Medical History:  Diagnosis Date  . Diverticulitis   . Hypertension   . Presence of dental prosthetic device    Implant- bottom right, caps "everywhere"  . Vaginal dryness, menopausal      Patient Active Problem List   Diagnosis Date Noted  . Diverticulitis of colon (without mention of hemorrhage)(562.11) 11/21/2008  . Abdominal pain, left lower quadrant 11/21/2008  . ABNORMAL FINDINGS GI TRACT 11/21/2008     Past Surgical History:  Procedure Laterality Date  . APPENDECTOMY    . BREAST BIOPSY Right 2006   benign-on skin  . CATARACT EXTRACTION W/PHACO Right 06/10/2019   Procedure: CATARACT EXTRACTION PHACO AND INTRAOCULAR LENS PLACEMENT ([place])  RIGHT;  Surgeon: Eulogio Bear, MD;  Location: [place];  Service: Ophthalmology;  Laterality: Right;  . CATARACT EXTRACTION W/PHACO Left 07/04/2019   Procedure: CATARACT EXTRACTION PHACO AND  INTRAOCULAR LENS PLACEMENT ([place]) LEFT;  Surgeon: Eulogio Bear, MD;  Location: [place];  Service: Ophthalmology;  Laterality: Left;  PT WANTS TO BE ONE OF FIRST CASES     Prior to Admission medications   Medication Sig Start Date End Date Taking? Authorizing Provider  famotidine (PEPCID) 20 MG tablet Take 1 tablet (20 mg total) by mouth 2 (two) times daily. 04/16/21  Yes Carrie Mew, MD  sucralfate (CARAFATE) 1 g tablet Take 1 tablet (1 g total) by mouth 4 (four) times daily. 04/16/21  Yes Carrie Mew, MD  loratadine (CLARITIN) 10 MG tablet Take 10 mg by mouth daily as needed for allergies.    [provider]  Multiple Vitamin (MULTIVITAMIN) tablet Take 1 tablet by mouth daily.    [provider]  Probiotic Product (PROBIOTIC PO) Take by mouth daily.    [provider]  YUVAFEM 10 MCG TABS vaginal tablet INSERT 1 TABLET VAGINALLY 3 NIGHTS PER WEEK 08/18/19   Dove, Myra C, MD     Allergies Diclofenac sodium, Metronidazole, Penicillins, Pravastatin, Rofecoxib, Rosuvastatin, and Adhesive [tape]   Family History  Problem Relation Age of Onset  . Juvenile Diabetes Father   . Stroke Father   . Diabetes Mother   . Diabetes Maternal Grandmother   . Uterine cancer Sister   . Uterine cancer Sister   . Breast cancer Neg Hx     Social History Social History   Tobacco Use  . Smoking status: Never Smoker  . Smokeless tobacco: Never Used  Vaping Use  . Vaping Use: Never used  Substance  Use Topics  . Alcohol use: Yes    Alcohol/week: 5.0 standard drinks    Types: 5 Standard drinks or equivalent per week  . Drug use: No    Review of Systems  Constitutional:   No fever or chills.  ENT:   No sore throat. No rhinorrhea.  Positive dysphagia Cardiovascular:   No chest pain or syncope. Respiratory:   No dyspnea or cough. Gastrointestinal: Positive as above abdominal pain without vomiting and diarrhea.  Musculoskeletal:   Negative for  focal pain or swelling All other systems reviewed and are negative except as documented above in ROS and HPI.  ____________________________________________   PHYSICAL EXAM:  VITAL SIGNS: ED Triage Vitals  Enc Vitals Group     BP 04/16/21 1155 (!) 158/123     Pulse Rate 04/16/21 1155 84     Resp 04/16/21 1155 18     Temp 04/16/21 1155 98 F (36.7 C)     Temp src --      SpO2 04/16/21 1155 97 %     Weight --      Height --      Head Circumference --      Peak Flow --      Pain Score 04/16/21 1152 6     Pain Loc --      Pain Edu? --      Excl. in [place]? --     Vital signs reviewed, nursing assessments reviewed.   Constitutional:   Alert and oriented. Non-toxic appearance. Eyes:   Conjunctivae are normal. EOMI. PERRL. ENT      Head:   Normocephalic and atraumatic.      Nose:   Normal      Mouth/Throat:   Normal, moist mucosa      Neck:   No meningismus. Full ROM. Hematological/Lymphatic/Immunilogical:   No cervical lymphadenopathy. Cardiovascular:   RRR. Symmetric bilateral radial and DP pulses.  No murmurs. Cap refill less than 2 seconds. Respiratory:   Normal respiratory effort without tachypnea/retractions. Breath sounds are clear and equal bilaterally. No wheezes/rales/rhonchi. Gastrointestinal:   Soft with diffuse lower abdominal tenderness worse in the left lower quadrant. Non distended. There is no CVA tenderness.  No rebound, rigidity, or guarding. Genitourinary:   deferred Musculoskeletal:   Normal range of motion in all extremities. No joint effusions.  No lower extremity tenderness.  No edema. Neurologic:   Normal speech and language.  Motor grossly intact. No acute focal neurologic deficits are appreciated.  Skin:    Skin is warm, dry and intact. No rash noted.  No petechiae, purpura, or bullae.  ____________________________________________    LABS (pertinent positives/negatives) (all labs ordered are listed, but only abnormal results are displayed) Labs  Reviewed  COMPREHENSIVE METABOLIC PANEL - Abnormal; Notable for the following components:      Result Value   Glucose, Bld 101 (*)    All other components within normal limits  LIPASE, BLOOD  CBC  URINALYSIS, COMPLETE (UACMP) WITH MICROSCOPIC  TROPONIN I (HIGH SENSITIVITY)  TROPONIN I (HIGH SENSITIVITY)   ____________________________________________   EKG Interpreted by me Sinus rhythm rate of 72, normal axis, left bundle branch block, no acute ischemic changes.   ____________________________________________    RADIOLOGY  CT ABDOMEN PELVIS WO CONTRAST  Result Date: 04/16/2021 CLINICAL DATA:  Abdominal pain, dizziness and diarrhea. EXAM: CT ABDOMEN AND PELVIS WITHOUT CONTRAST TECHNIQUE: Multidetector CT imaging of the abdomen and pelvis was performed following the standard protocol without IV contrast. COMPARISON:  CT abdomen and  pelvis 08/11/2013 and 05/18/2013. FINDINGS: Lower chest: A 0.7 cm right lower lobe nodule on image 6 and a punctate left lower lobe nodule on image 13 are unchanged consistent with benignity. Lung bases otherwise clear. Heart size normal. No pleural or pericardial effusion. Hepatobiliary: No focal liver abnormality is seen. No gallstones, gallbladder wall thickening, or biliary dilatation. The liver is diffusely low attenuating consistent with fatty infiltration. Pancreas: Unremarkable. No pancreatic ductal dilatation or surrounding inflammatory changes. Spleen: Normal in size without focal abnormality. Adrenals/Urinary Tract: Adrenal glands are unremarkable. Kidneys are normal, without renal calculi, focal lesion, or hydronephrosis. Bladder is unremarkable. Stomach/Bowel: The patient has sigmoid diverticulosis with mild stranding seen about a diverticulum along the mid sigmoid colon consistent with acute diverticulitis. No abscess or perforation. The colon is otherwise unremarkable. The stomach and small bowel appear normal. The patient is status post appendectomy.  Vascular/Lymphatic: Aortic atherosclerosis. No enlarged abdominal or pelvic lymph nodes. Reproductive: Uterus and bilateral adnexa are unremarkable. Other: None. Musculoskeletal: No acute or focal abnormality. Lumbar degenerative disease and bilateral hip osteoarthritis noted. IMPRESSION: Mild appearing sigmoid diverticulitis without abscess or perforation. Fatty infiltration of the liver. Aortic Atherosclerosis (ICD10-I70.0). Electronically Signed   By: Inge Rise M.D.   On: 04/16/2021 15:03    ____________________________________________   PROCEDURES Procedures  ____________________________________________  DIFFERENTIAL DIAGNOSIS   Diverticulitis, intra-abdominal abscess, GERD, UTI, non-STEMI  CLINICAL IMPRESSION / ASSESSMENT AND PLAN / ED COURSE  Medications ordered in the ED: Medications  alum & mag hydroxide-simeth (MAALOX/MYLANTA) 200-200-20 MG/5ML suspension 30 mL (30 mLs Oral Given 04/16/21 1416)  famotidine (PEPCID) tablet 40 mg (40 mg Oral Given 04/16/21 1416)    Pertinent labs & imaging results that were available during my care of the patient were reviewed by me and considered in my medical decision making (see chart for details).  Anita Hutchinson was evaluated in Emergency Department on 04/16/2021 for the symptoms described in the history of present illness. She was evaluated in the context of the global COVID-19 pandemic, which necessitated consideration that the patient might be at risk for infection with the SARS-CoV-2 virus that causes COVID-19. Institutional protocols and algorithms that pertain to the evaluation of patients at risk for COVID-19 are in a state of rapid change based on information released by regulatory bodies including the CDC and federal and state organizations. These policies and algorithms were followed during the patient's care in the ED.   Patient presents with 2 separate complaints.  First is intermittent chest discomfort symptoms, most likely  dysphagia and related to swallowing.  Recommend follow-up with GI after a trial of antiacid therapy.  We will check serial troponins due to age and comorbidities but suspicion for ACS is low.  Doubt PE dissection or AAA.  Abdominal pain and tenderness most likely diverticulitis, will obtain CT since she recently had a similar bout which has recurred in a short time.  Doubt mesenteric ischemia, dissection, bowel perforation or biliary disease.      ____________________________________________   FINAL CLINICAL IMPRESSION(S) / ED DIAGNOSES    Final diagnoses:  Lower abdominal pain  Dysphagia, unspecified type     ED Discharge Orders         Ordered    sucralfate (CARAFATE) 1 g tablet  4 times daily        04/16/21 1508    famotidine (PEPCID) 20 MG tablet  2 times daily        04/16/21 1508  Portions of this note were generated with dragon dictation software. Dictation errors may occur despite best attempts at proofreading.   Carrie Mew, MD 04/16/21 [phone]

## 2021-04-16 NOTE — Discharge Instructions (Addendum)
Please discuss with your gastroenterologist tomorrow the appropriate treatment for this mild diverticulitis.

## 2021-05-20 ENCOUNTER — Other Ambulatory Visit: Payer: Self-pay | Admitting: Family Medicine

## 2021-05-20 DIAGNOSIS — Z1231 Encounter for screening mammogram for malignant neoplasm of breast: Secondary | ICD-10-CM

## 2021-06-04 ENCOUNTER — Ambulatory Visit
Admission: RE | Admit: 2021-06-04 | Discharge: 2021-06-04 | Disposition: A | Discharge status: Home / Self Care | Payer: Medicare Other | Source: Ambulatory Visit | Attending: Family Medicine | Admitting: Family Medicine

## 2021-06-04 ENCOUNTER — Other Ambulatory Visit: Payer: Self-pay

## 2021-06-04 DIAGNOSIS — Z1231 Encounter for screening mammogram for malignant neoplasm of breast: Secondary | ICD-10-CM | POA: Insufficient documentation

## 2021-11-25 ENCOUNTER — Other Ambulatory Visit
Admission: RE | Admit: 2021-11-25 | Discharge: 2021-11-25 | Disposition: A | Discharge status: Home / Self Care | Payer: Medicare Other | Source: Ambulatory Visit | Attending: Gastroenterology | Admitting: Gastroenterology

## 2021-11-25 DIAGNOSIS — R1032 Left lower quadrant pain: Secondary | ICD-10-CM | POA: Diagnosis present

## 2021-11-25 DIAGNOSIS — R197 Diarrhea, unspecified: Secondary | ICD-10-CM | POA: Diagnosis not present

## 2021-11-25 LAB — C DIFFICILE QUICK SCREEN W PCR REFLEX
C Diff antigen: NEGATIVE
C Diff interpretation: NOT DETECTED
C Diff toxin: NEGATIVE

## 2021-11-27 LAB — GASTROINTESTINAL PANEL BY PCR, STOOL (REPLACES STOOL CULTURE)

## 2021-11-28 ENCOUNTER — Other Ambulatory Visit: Payer: Self-pay | Admitting: Gastroenterology

## 2021-11-28 DIAGNOSIS — R197 Diarrhea, unspecified: Secondary | ICD-10-CM

## 2021-11-28 DIAGNOSIS — K219 Gastro-esophageal reflux disease without esophagitis: Secondary | ICD-10-CM

## 2021-11-28 DIAGNOSIS — R1032 Left lower quadrant pain: Secondary | ICD-10-CM

## 2021-12-24 ENCOUNTER — Other Ambulatory Visit: Payer: Self-pay

## 2021-12-24 ENCOUNTER — Ambulatory Visit
Admission: RE | Admit: 2021-12-24 | Discharge: 2021-12-24 | Disposition: A | Discharge status: Home / Self Care | Payer: Medicare Other | Source: Ambulatory Visit | Attending: Gastroenterology | Admitting: Gastroenterology

## 2021-12-24 DIAGNOSIS — R1032 Left lower quadrant pain: Secondary | ICD-10-CM | POA: Insufficient documentation

## 2021-12-24 DIAGNOSIS — R197 Diarrhea, unspecified: Secondary | ICD-10-CM | POA: Insufficient documentation

## 2021-12-24 DIAGNOSIS — K219 Gastro-esophageal reflux disease without esophagitis: Secondary | ICD-10-CM | POA: Diagnosis present

## 2021-12-24 MED ORDER — IOHEXOL 300 MG/ML  SOLN
100.0000 mL | Freq: Once | INTRAMUSCULAR | Status: AC | PRN
  Administered 2021-12-24: 100 mL via INTRAVENOUS

## 2022-01-03 ENCOUNTER — Encounter: Payer: Self-pay | Admitting: Gastroenterology

## 2022-01-06 ENCOUNTER — Encounter
Admission: RE | Disposition: A | Discharge status: Home / Self Care | Payer: Self-pay | Source: Ambulatory Visit | Attending: Gastroenterology

## 2022-01-06 ENCOUNTER — Ambulatory Visit: Payer: Medicare Other | Admitting: Certified Registered Nurse Anesthetist

## 2022-01-06 ENCOUNTER — Encounter: Payer: Self-pay | Admitting: Gastroenterology

## 2022-01-06 ENCOUNTER — Other Ambulatory Visit: Payer: Self-pay

## 2022-01-06 ENCOUNTER — Ambulatory Visit
Admission: RE | Admit: 2022-01-06 | Discharge: 2022-01-06 | Disposition: A | Discharge status: Home / Self Care | Payer: Medicare Other | Source: Ambulatory Visit | Attending: Gastroenterology | Admitting: Gastroenterology

## 2022-01-06 DIAGNOSIS — K52831 Collagenous colitis: Secondary | ICD-10-CM | POA: Diagnosis not present

## 2022-01-06 DIAGNOSIS — K56699 Other intestinal obstruction unspecified as to partial versus complete obstruction: Secondary | ICD-10-CM | POA: Insufficient documentation

## 2022-01-06 DIAGNOSIS — K529 Noninfective gastroenteritis and colitis, unspecified: Secondary | ICD-10-CM | POA: Insufficient documentation

## 2022-01-06 DIAGNOSIS — E785 Hyperlipidemia, unspecified: Secondary | ICD-10-CM | POA: Insufficient documentation

## 2022-01-06 DIAGNOSIS — K219 Gastro-esophageal reflux disease without esophagitis: Secondary | ICD-10-CM | POA: Insufficient documentation

## 2022-01-06 DIAGNOSIS — I10 Essential (primary) hypertension: Secondary | ICD-10-CM | POA: Diagnosis not present

## 2022-01-06 DIAGNOSIS — Z79899 Other long term (current) drug therapy: Secondary | ICD-10-CM | POA: Insufficient documentation

## 2022-01-06 DIAGNOSIS — R197 Diarrhea, unspecified: Secondary | ICD-10-CM | POA: Diagnosis present

## 2022-01-06 DIAGNOSIS — K449 Diaphragmatic hernia without obstruction or gangrene: Secondary | ICD-10-CM | POA: Diagnosis not present

## 2022-01-06 DIAGNOSIS — K5730 Diverticulosis of large intestine without perforation or abscess without bleeding: Secondary | ICD-10-CM | POA: Insufficient documentation

## 2022-01-06 HISTORY — DX: Personal history of urinary calculi: Z87.442

## 2022-01-06 HISTORY — DX: Hyperlipidemia, unspecified: E78.5

## 2022-01-06 HISTORY — DX: Other seasonal allergic rhinitis: J30.2

## 2022-01-06 HISTORY — PX: ESOPHAGOGASTRODUODENOSCOPY (EGD) WITH PROPOFOL: SHX5813

## 2022-01-06 HISTORY — DX: Solitary pulmonary nodule: R91.1

## 2022-01-06 HISTORY — PX: COLONOSCOPY WITH PROPOFOL: SHX5780

## 2022-01-06 SURGERY — COLONOSCOPY WITH PROPOFOL
Anesthesia: General

## 2022-01-06 MED ORDER — SODIUM CHLORIDE 0.9 % IV SOLN: INTRAVENOUS | Status: DC

## 2022-01-06 MED ORDER — LIDOCAINE HCL (PF) 2 % IJ SOLN
INTRAMUSCULAR | Status: AC
  Filled 2022-01-06: qty 5

## 2022-01-06 MED ORDER — PROPOFOL 10 MG/ML IV BOLUS
INTRAVENOUS | Status: AC
  Filled 2022-01-06: qty 20

## 2022-01-06 MED ORDER — PROPOFOL 500 MG/50ML IV EMUL
INTRAVENOUS | Status: AC
  Filled 2022-01-06: qty 50

## 2022-01-06 MED ORDER — PROPOFOL 10 MG/ML IV BOLUS
INTRAVENOUS | Status: DC | PRN
  Administered 2022-01-06: 80 mg via INTRAVENOUS

## 2022-01-06 MED ORDER — LIDOCAINE HCL (CARDIAC) PF 100 MG/5ML IV SOSY
PREFILLED_SYRINGE | INTRAVENOUS | Status: DC | PRN
  Administered 2022-01-06: 50 mg via INTRAVENOUS

## 2022-01-06 MED ORDER — PROPOFOL 500 MG/50ML IV EMUL
INTRAVENOUS | Status: DC | PRN
  Administered 2022-01-06: 150 ug/kg/min via INTRAVENOUS

## 2022-01-06 NOTE — Op Note (Signed)
[facility] Gastroenterology Patient Name: Anita Hutchinson Procedure Date: 01/06/2022 11:16 AM MRN: [id number] Account #: [id number] Date of Birth: 17-Jul-1951 Admit Type: Outpatient Age: 71 Room: [facility] ENDO ROOM 3 Gender: Female Note Status: Finalized Instrument Name: Altamese Cabal Endoscope [id number] Procedure:             Upper GI endoscopy Indications:           Gastro-esophageal reflux disease Providers:             Andrey Farmer MD, MD Medicines:             Monitored Anesthesia Care Complications:         No immediate complications. Estimated blood loss:                         Minimal. Procedure:             Pre-Anesthesia Assessment:                        - Prior to the procedure, a History and Physical was                         performed, and patient medications and allergies were                         reviewed. The patient is competent. The risks and                         benefits of the procedure and the sedation options and                         risks were discussed with the patient. All questions                         were answered and informed consent was obtained.                         Patient identification and proposed procedure were                         verified by the physician, the nurse, the                         anesthesiologist, the anesthetist and the technician                         in the endoscopy suite. Mental Status Examination:                         alert and oriented. Airway Examination: normal                         oropharyngeal airway and neck mobility. Respiratory                         Examination: clear to auscultation. CV Examination:                         normal. Prophylactic Antibiotics: The patient does not  require prophylactic antibiotics. Prior                         Anticoagulants: The patient has taken no previous                         anticoagulant or antiplatelet agents  except for                         aspirin. ASA Grade Assessment: II - A patient with                         mild systemic disease. After reviewing the risks and                         benefits, the patient was deemed in satisfactory                         condition to undergo the procedure. The anesthesia                         plan was to use monitored anesthesia care (MAC).                         Immediately prior to administration of medications,                         the patient was re-assessed for adequacy to receive                         sedatives. The heart rate, respiratory rate, oxygen                         saturations, blood pressure, adequacy of pulmonary                         ventilation, and response to care were monitored                         throughout the procedure. The physical status of the                         patient was re-assessed after the procedure.                        After obtaining informed consent, the endoscope was                         passed under direct vision. Throughout the procedure,                         the patient's blood pressure, pulse, and oxygen                         saturations were monitored continuously. The Endoscope                         was introduced through the mouth, and advanced to the  second part of duodenum. The upper GI endoscopy was                         accomplished without difficulty. The patient tolerated                         the procedure well. Findings:      A small hiatal hernia was present.      The examined esophagus was normal. Biopsies were obtained from the       proximal and distal esophagus with cold forceps for histology of       suspected eosinophilic esophagitis.      The Z-line was regular and was found 37 cm from the incisors.      The entire examined stomach was normal.      The examined duodenum was normal. Impression:            - Small hiatal hernia.                         - Normal esophagus. Biopsied.                        - Z-line regular, 37 cm from the incisors.                        - Normal stomach.                        - Normal examined duodenum. Recommendation:        - Await pathology results.                        - Perform a colonoscopy today. Procedure Code(s):     --- Professional ---                        [phone], Esophagogastroduodenoscopy, flexible,                         transoral; with biopsy, single or multiple Diagnosis Code(s):     --- Professional ---                        K44.9, Diaphragmatic hernia without obstruction or                         gangrene                        K21.9, Gastro-esophageal reflux disease without                         esophagitis CPT copyright 2019 American Medical Association. All rights reserved. The codes documented in this report are preliminary and upon coder review may  be revised to meet current compliance requirements. Andrey Farmer MD, MD 01/06/2022 12:32:20 PM Number of Addenda: 0 Note Initiated On: 01/06/2022 11:16 AM Estimated Blood Loss:  Estimated blood loss was minimal.      [facility]

## 2022-01-06 NOTE — Transfer of Care (Signed)
Immediate Anesthesia Transfer of Care Note  Patient: Anita Hutchinson  Procedure(s) Performed: COLONOSCOPY WITH PROPOFOL ESOPHAGOGASTRODUODENOSCOPY (EGD) WITH PROPOFOL  Patient Location: PACU  Anesthesia Type:General  Level of Consciousness: sedated  Airway & Oxygen Therapy: Patient Spontanous Breathing  Post-op Assessment: Report given to RN and Post -op Vital signs reviewed and stable  Post vital signs: Reviewed and stable  Last Vitals:  Vitals Value Taken Time  BP 123/72 01/06/22 1229  Temp 35.9 C 01/06/22 1229  Pulse 74 01/06/22 1229  Resp 13 01/06/22 1229  SpO2 96 % 01/06/22 1229    Last Pain:  Vitals:   01/06/22 1229  TempSrc: Tympanic  PainSc: Asleep         Complications: No notable events documented.

## 2022-01-06 NOTE — Anesthesia Postprocedure Evaluation (Signed)
Anesthesia Post Note  Patient: NANDIKA STETZER  Procedure(s) Performed: COLONOSCOPY WITH PROPOFOL ESOPHAGOGASTRODUODENOSCOPY (EGD) WITH PROPOFOL  Patient location during evaluation: Endoscopy Anesthesia Type: General Level of consciousness: awake and alert Pain management: pain level controlled Vital Signs Assessment: post-procedure vital signs reviewed and stable Respiratory status: spontaneous breathing, nonlabored ventilation, respiratory function stable and patient connected to nasal cannula oxygen Cardiovascular status: blood pressure returned to baseline and stable Postop Assessment: no apparent nausea or vomiting Anesthetic complications: no   No notable events documented.   Last Vitals:  Vitals:   01/06/22 1249 01/06/22 1259  BP: 114/77 131/82  Pulse: 74 70  Resp: 16 17  Temp:    SpO2: 96% 97%    Last Pain:  Vitals:   01/06/22 1259  TempSrc:   PainSc: 0-No pain                 Arita Miss

## 2022-01-06 NOTE — H&P (Signed)
Outpatient short stay form Pre-procedure 01/06/2022  [name], MD  Primary Physician: Dion Body, MD  Reason for visit:  Epigastric pain and chronic diarrhea  History of present illness:    71 y/o lady with history of diverticulitis and chronic diarrhea here for EGD/Colonoscopy. Last colonoscopy in 2019 was overall normal. Has trouble swallowing cold liquids but not food. No blood thinners except aspirin. No abdominal surgeries except appendectomy. No abdominal pain currently.    Current Facility-Administered Medications:    0.9 %  sodium chloride infusion, , Intravenous, Continuous, Griffin Dewilde, Hilton Cork, MD, Last Rate: 20 mL/hr at 01/06/22 1059, Restarted at 01/06/22 1114  Medications Prior to Admission  Medication Sig Dispense Refill Last Dose   aspirin EC 81 MG tablet Take 81 mg by mouth daily. Swallow whole.   Past Week   loratadine (CLARITIN) 10 MG tablet Take 10 mg by mouth daily as needed for allergies.   01/05/2022   Multiple Vitamin (MULTIVITAMIN) tablet Take 1 tablet by mouth daily.   Past Week   Probiotic Product (PROBIOTIC PO) Take by mouth daily.   Past Week   YUVAFEM 10 MCG TABS vaginal tablet INSERT 1 TABLET VAGINALLY 3 NIGHTS PER WEEK 30 tablet 4 Past Week   famotidine (PEPCID) 20 MG tablet Take 1 tablet (20 mg total) by mouth 2 (two) times daily. (Patient not taking: Reported on 01/06/2022) 60 tablet 0 Not Taking   sucralfate (CARAFATE) 1 g tablet Take 1 tablet (1 g total) by mouth 4 (four) times daily. 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    Physical Exam  Gen: Alert, oriented. Appears stated age.  HEENT: PERRLA. Lungs: No respiratory distress CV: RRR Abd: soft, benign, no masses Ext: No edema    Planned procedures: Proceed with EGD/colonoscopy. The patient understands the nature of the planned procedure, indications, risks, alternatives and potential complications including but not limited to bleeding, infection, perforation, damage to internal organs and possible oversedation/side effects from anesthesia. The patient agrees and gives consent to proceed.  Please refer to procedure notes for findings, recommendations and patient disposition/instructions.     [name], MD [facility]

## 2022-01-06 NOTE — Interval H&P Note (Signed)
History and Physical Interval Note:  01/06/2022 11:27 AM  Anita Hutchinson  has presented today for surgery, with the diagnosis of R10.32  - LLQ pain R19.7  - Diarrhea, unspecified type K21.9  - Gastroesophageal reflux disease, unspecified whether.  The various methods of treatment have been discussed with the patient and family. After consideration of risks, benefits and other options for treatment, the patient has consented to  Procedure(s): COLONOSCOPY WITH PROPOFOL (N/A) ESOPHAGOGASTRODUODENOSCOPY (EGD) WITH PROPOFOL (N/A) as a surgical intervention.  The patient's history has been reviewed, patient examined, no change in status, stable for surgery.  I have reviewed the patient's chart and labs.  Questions were answered to the patient's satisfaction.     [name]  Ok to proceed with EGD/Colonoscopy

## 2022-01-06 NOTE — Anesthesia Procedure Notes (Signed)
Date/Time: 01/06/2022 11:30 AM Performed by: Johnna Acosta, CRNA Pre-anesthesia Checklist: Patient identified, Emergency Drugs available, Suction available, Patient being monitored and Timeout performed Patient Re-evaluated:Patient Re-evaluated prior to induction Oxygen Delivery Method: Nasal cannula Preoxygenation: Pre-oxygenation with 100% oxygen Induction Type: IV induction

## 2022-01-06 NOTE — Anesthesia Preprocedure Evaluation (Signed)
Anesthesia Evaluation  Patient identified by MRN, date of birth, ID band Patient awake    Reviewed: Allergy & Precautions, NPO status , Patient's Chart, lab work & pertinent test results  History of Anesthesia Complications Negative for: history of anesthetic complications  Airway Mallampati: II  TM Distance: >3 FB Neck ROM: Full    Dental no notable dental hx. (+) Teeth Intact   Pulmonary neg pulmonary ROS, neg sleep apnea, neg COPD, Patient abstained from smoking.Not current smoker,    Pulmonary exam normal breath sounds clear to auscultation       Cardiovascular Exercise Tolerance: Good METShypertension, (-) CAD and (-) Past MI (-) dysrhythmias  Rhythm:Regular Rate:Normal - Systolic murmurs    Neuro/Psych negative neurological ROS  negative psych ROS   GI/Hepatic neg GERD  ,(+)     (-) substance abuse  ,   Endo/Other  neg diabetes  Renal/GU negative Renal ROS     Musculoskeletal   Abdominal   Peds  Hematology   Anesthesia Other Findings Past Medical History: No date: Diverticulitis No date: History of kidney stones No date: Hyperlipidemia No date: Hypertension No date: Presence of dental prosthetic device     Comment:  Implant- bottom right, caps "everywhere" No date: Pulmonary nodule No date: Seasonal allergies No date: Vaginal dryness, menopausal  Reproductive/Obstetrics                             Anesthesia Physical Anesthesia Plan  ASA: 2  Anesthesia Plan: General   Post-op Pain Management: Minimal or no pain anticipated   Induction: Intravenous  PONV Risk Score and Plan: 3 and Propofol infusion, TIVA and Ondansetron  Airway Management Planned: Nasal Cannula  Additional Equipment: None  Intra-op Plan:   Post-operative Plan:   Informed Consent: I have reviewed the patients History and Physical, chart, labs and discussed the procedure including the risks,  benefits and alternatives for the proposed anesthesia with the patient or authorized representative who has indicated his/her understanding and acceptance.     Dental advisory given  Plan Discussed with: CRNA and Surgeon  Anesthesia Plan Comments: (Discussed risks of anesthesia with patient, including possibility of difficulty with spontaneous ventilation under anesthesia necessitating airway intervention, PONV, and rare risks such as cardiac or respiratory or neurological events, and allergic reactions. Discussed the role of CRNA in patient's perioperative care. Patient understands.)        Anesthesia Quick Evaluation

## 2022-01-06 NOTE — Op Note (Signed)
[facility] Patient Name: Anita Hutchinson Procedure Date: 01/06/2022 11:16 AM MRN: [id number] Account #: [id number] Date of Birth: November 08, 1951 Admit Type: Outpatient Age: 71 Room: [facility] ENDO ROOM 3 Gender: Female Note Status: Finalized Instrument Name: Colonoscope [id number] Procedure:             Colonoscopy Indications:           Follow-up of diverticulitis, Chronic diarrhea Providers:             Andrey Farmer MD, MD Medicines:             Monitored Anesthesia Care Complications:         No immediate complications. Estimated blood loss:                         Minimal. Procedure:             Pre-Anesthesia Assessment:                        - Prior to the procedure, a History and Physical was                         performed, and patient medications and allergies were                         reviewed. The patient is competent. The risks and                         benefits of the procedure and the sedation options and                         risks were discussed with the patient. All questions                         were answered and informed consent was obtained.                         Patient identification and proposed procedure were                         verified by the physician, the nurse, the                         anesthesiologist, the anesthetist and the technician                         in the endoscopy suite. Mental Status Examination:                         alert and oriented. Airway Examination: normal                         oropharyngeal airway and neck mobility. Respiratory                         Examination: clear to auscultation. CV Examination:                         normal. Prophylactic Antibiotics: The patient does not  require prophylactic antibiotics. Prior                         Anticoagulants: The patient has taken no previous                         anticoagulant or antiplatelet agents  except for                         aspirin. ASA Grade Assessment: II - A patient with                         mild systemic disease. After reviewing the risks and                         benefits, the patient was deemed in satisfactory                         condition to undergo the procedure. The anesthesia                         plan was to use monitored anesthesia care (MAC).                         Immediately prior to administration of medications,                         the patient was re-assessed for adequacy to receive                         sedatives. The heart rate, respiratory rate, oxygen                         saturations, blood pressure, adequacy of pulmonary                         ventilation, and response to care were monitored                         throughout the procedure. The physical status of the                         patient was re-assessed after the procedure.                        After obtaining informed consent, the colonoscope was                         passed under direct vision. Throughout the procedure,                         the patient's blood pressure, pulse, and oxygen                         saturations were monitored continuously. The                         Colonoscope was introduced through the anus and  advanced to the the terminal ileum. The colonoscopy                         was technically difficult and complex due to bowel                         stenosis. Successful completion of the procedure was                         aided by withdrawing the scope and replacing with the                         pediatric colonoscope. The patient tolerated the                         procedure well. The quality of the bowel preparation                         was good. Findings:      The perianal and digital rectal examinations were normal.      The terminal ileum appeared normal.      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      Biopsies for histology were taken with a cold forceps from the entire       colon for evaluation of microscopic colitis.      Multiple small-mouthed diverticula were found in the sigmoid colon.      The exam was otherwise without abnormality on direct and retroflexion       views. Impression:            - The examined portion of the ileum was normal.                        - Stricture in the sigmoid colon.                        - Diverticulosis in the sigmoid colon.                        - The examination was otherwise normal on direct and                         retroflexion views.                        - Biopsies were taken with a cold forceps from the                         entire colon for evaluation of microscopic colitis. Recommendation:        - Discharge patient to home.                        - Resume previous diet.                        - Continue present medications.                        -  Await pathology results.                        - Repeat colonoscopy for surveillance based on                         pathology results.                        - Return to referring physician as previously                         scheduled. Procedure Code(s):     --- Professional ---                        [phone], Colonoscopy, flexible; with biopsy, single or                         multiple Diagnosis Code(s):     --- Professional ---                        [phone], Other intestinal obstruction unspecified as                         to partial versus complete obstruction                        K57.32, Diverticulitis of large intestine without                         perforation or abscess without bleeding                        K52.9, Noninfective gastroenteritis and colitis,                         unspecified                        K57.30, Diverticulosis of large intestine without                          perforation or abscess without bleeding CPT copyright 2019 [employer]. All rights reserved. The codes documented in this report are preliminary and upon coder review may  be revised to meet current compliance requirements. Andrey Farmer MD, MD 01/06/2022 12:37:26 PM Number of Addenda: 0 Note Initiated On: 01/06/2022 11:16 AM Scope Withdrawal Time: 0 hours 11 minutes 22 seconds  Total Procedure Duration: 0 hours 42 minutes 23 seconds  Estimated Blood Loss:  Estimated blood loss was minimal.      [facility]

## 2022-01-07 ENCOUNTER — Encounter: Payer: Self-pay | Admitting: Gastroenterology

## 2022-01-08 LAB — SURGICAL PATHOLOGY

## 2022-04-03 ENCOUNTER — Ambulatory Visit
Admission: RE | Admit: 2022-04-03 | Discharge: 2022-04-03 | Disposition: A | Discharge status: Home / Self Care | Payer: Medicare Other | Source: Ambulatory Visit | Attending: Ophthalmology | Admitting: Ophthalmology

## 2022-04-03 ENCOUNTER — Other Ambulatory Visit: Payer: Self-pay | Admitting: Ophthalmology

## 2022-04-03 DIAGNOSIS — H05011 Cellulitis of right orbit: Secondary | ICD-10-CM | POA: Insufficient documentation

## 2022-04-03 LAB — POCT I-STAT CREATININE: Creatinine, Ser: 0.8 mg/dL (ref 0.44–1.00)

## 2022-04-03 MED ORDER — IOHEXOL 300 MG/ML  SOLN
75.0000 mL | Freq: Once | INTRAMUSCULAR | Status: AC | PRN
  Administered 2022-04-03: 75 mL via INTRAVENOUS

## 2022-06-24 ENCOUNTER — Other Ambulatory Visit: Payer: Self-pay | Admitting: Family Medicine

## 2022-06-24 DIAGNOSIS — Z1231 Encounter for screening mammogram for malignant neoplasm of breast: Secondary | ICD-10-CM

## 2022-07-16 ENCOUNTER — Ambulatory Visit
Admission: RE | Admit: 2022-07-16 | Discharge: 2022-07-16 | Disposition: A | Discharge status: Home / Self Care | Payer: Medicare Other | Source: Ambulatory Visit | Attending: Family Medicine | Admitting: Family Medicine

## 2022-07-16 DIAGNOSIS — Z1231 Encounter for screening mammogram for malignant neoplasm of breast: Secondary | ICD-10-CM | POA: Insufficient documentation

## 2022-08-18 ENCOUNTER — Ambulatory Visit (INDEPENDENT_AMBULATORY_CARE_PROVIDER_SITE_OTHER): Payer: Medicare Other | Admitting: Dermatology

## 2022-08-18 DIAGNOSIS — Z1283 Encounter for screening for malignant neoplasm of skin: Secondary | ICD-10-CM

## 2022-08-18 DIAGNOSIS — L814 Other melanin hyperpigmentation: Secondary | ICD-10-CM

## 2022-08-18 DIAGNOSIS — L578 Other skin changes due to chronic exposure to nonionizing radiation: Secondary | ICD-10-CM

## 2022-08-18 DIAGNOSIS — Z79899 Other long term (current) drug therapy: Secondary | ICD-10-CM | POA: Diagnosis not present

## 2022-08-18 DIAGNOSIS — L219 Seborrheic dermatitis, unspecified: Secondary | ICD-10-CM | POA: Diagnosis not present

## 2022-08-18 DIAGNOSIS — D1801 Hemangioma of skin and subcutaneous tissue: Secondary | ICD-10-CM

## 2022-08-18 DIAGNOSIS — L821 Other seborrheic keratosis: Secondary | ICD-10-CM

## 2022-08-18 DIAGNOSIS — D229 Melanocytic nevi, unspecified: Secondary | ICD-10-CM

## 2022-08-18 DIAGNOSIS — Z808 Family history of malignant neoplasm of other organs or systems: Secondary | ICD-10-CM

## 2022-08-18 MED ORDER — KETOCONAZOLE 2 % EX CREA: TOPICAL_CREAM | CUTANEOUS | 6 refills | Status: DC

## 2022-08-18 MED ORDER — HYDROCORTISONE 2.5 % EX CREA: TOPICAL_CREAM | CUTANEOUS | 6 refills | Status: DC

## 2022-08-18 NOTE — Patient Instructions (Addendum)
Discontinue Triamcinolone cream at face Start ketoconazole cream - apply topically to affected areas of face m-w-f nightly  Start Hydrocortisone cream 2.5 % - apply topically to affected areas of face T-Th-Sat nightly  If staying clear can continue creams twice weekly to affected areas of face.    Topical steroids (such as triamcinolone, fluocinolone, fluocinonide, mometasone, clobetasol, halobetasol, betamethasone, hydrocortisone) can cause thinning and lightening of the skin if they are used for too long in the same area. Your physician has selected the right strength medicine for your problem and area affected on the body. Please use your medication only as directed by your physician to prevent side effects.      Seborrheic Dermatitis  What is seborrheic dermatitis? Seborrheic (say: seb-oh-ree-ick) dermatitis is a disease that causes flaking of the skin.  It usually affects the scalp.  In teenagers and adults, it is commonly called "dandruff".  In infants, it is referred to as "cradle cap".  Dandruff often appears as scaling on the scalp with or without redness.  On other parts of the body, seborrheic dermatitis tends to produce both redness and scaling.  Other common locations of seborrheic dermatitis include the central face, eyebrows, chest, and the creases of the arms, legs, and groin.  It often causes the skin to look a little greasy, scaly, or flaky. Seborrheic dermatitis can occur at any age.  It often comes and goes and may to be seasonally related, especially in the Northern climates.  What causes seborrheic dermatitis? The exact cause is not known, though yeast of the Malassezia species may be involved.  This organism is normally present on the skin in small numbers, but sometimes its numbers increase, especially in oily skin.  Treatments that reduce the yeast tend to improve seborrheic dermatitis.  How is seborrheic dermatitis treated? The treatment of seborrheic dermatitis depends  on its location on the body and the person's age. Seborrheic dermatitis of the scalp (dandruff) in adults and teenagers is usually treated with a medicated shampoo.  Here is a list of the medications that help, and the over-counter shampoos that contain them: Salicylic acid (Neutrogena T/Sal, Sebulex, Scalpicin, Denorex Extra Strength) Zinc pyrithione (Head & Shoulders white bottle, Denorex Daily, DHS Zinc, Pantene Pro-V Pyrithione Zinc) Selenium sulfide (Head & Shoulders blue bottle, Selsun Blue, Exsel Lotion Shampoo, Glo-Sel) Yahoo tar (Neutrogena T/Gal, Pentrax, Zetar, Tegrin, Viacom, Therapeutic Denorex) Ketoconazole (Nizoral)  If you have dandruff, you might start by using one of these shampoos every day until your dandruff is controlled and then keep using it at least twice a week.  Often times your doctor will recommend a rotation of several different medicated shampoos as some will experience a plateau in the effectiveness of any one shampoo.   When you use a dandruff shampoo, rub the shampoo into your wet hair and massage into scalp thoroughly.  Let it stay on your hair and scalp for 5 minutes before rinsing.  If you have involvement in the eyebrows or face, you can lather those areas with the medicated shampoo as well, or use a medicated soap (ZNP-bar, Polytar Soap, SAStid, or sulfur soap).    If the wash or shampoo alone does not help, your doctor might want you to use a prescription medication once or twice a day.  Leave-in medications for the scalp are best applied by massaging into the scalp immediately after towel drying your hair, but may be applied even if you have not washed your hair.  Seborrheic dermatitis in  infants usually clears up by age 86 -57 months.  It may develop in the diaper area where it might be confused with diaper rash.  For milder cases you can try gently brushing out scales with a soft brush.  This is best done immediately after washing with a non-medicated baby  shampoo Wynetta Emery and Royce Macadamia, etc.).  Your doctor may recommend a medicated shampoo or a prescription topical medication.    Recommend Amlactin Rapid Relief or Cerave Cream  for dry skin     Gentle Skin Care Guide  1. Bathe no more than once a day.  2. Avoid bathing in hot water  3. Use a mild soap like Dove, Vanicream, Cetaphil, CeraVe. Can use Lever 2000 or Cetaphil antibacterial soap  4. Use soap only where you need it. On most days, use it under your arms, between your legs, and on your feet. Let the water rinse other areas unless visibly dirty.  5. When you get out of the bath/shower, use a towel to gently blot your skin dry, don't rub it.  6. While your skin is still a little damp, apply a moisturizing cream such as Vanicream, CeraVe, Cetaphil, Eucerin, Sarna lotion or plain Vaseline Jelly. For hands apply Neutrogena [place] Hand Cream or Excipial Hand Cream.  7. Reapply moisturizer any time you start to itch or feel dry.  8. Sometimes using free and clear laundry detergents can be helpful. Fabric softener sheets should be avoided. Downy Free & Gentle liquid, or any liquid fabric softener that is free of dyes and perfumes, it acceptable to use  9. If your doctor has given you prescription creams you may apply moisturizers over them       Seborrheic Keratosis  What causes seborrheic keratoses? Seborrheic keratoses are harmless, common skin growths that first appear during adult life.  As time goes by, more growths appear.  Some people may develop a large number of them.  Seborrheic keratoses appear on both covered and uncovered body parts.  They are not caused by sunlight.  The tendency to develop seborrheic keratoses can be inherited.  They vary in color from skin-colored to gray, brown, or even black.  They can be either smooth or have a rough, warty surface.   Seborrheic keratoses are superficial and look as if they were stuck on the skin.  Under the microscope  this type of keratosis looks like layers upon layers of skin.  That is why at times the top layer may seem to fall off, but the rest of the growth remains and re-grows.    Treatment Seborrheic keratoses do not need to be treated, but can easily be removed in the office.  Seborrheic keratoses often cause symptoms when they rub on clothing or jewelry.  Lesions can be in the way of shaving.  If they become inflamed, they can cause itching, soreness, or burning.  Removal of a seborrheic keratosis can be accomplished by freezing, burning, or surgery. If any spot bleeds, scabs, or grows rapidly, please return to have it checked, as these can be an indication of a skin cancer.     Melanoma ABCDEs  Melanoma is the most dangerous type of skin cancer, and is the leading cause of death from skin disease.  You are more likely to develop melanoma if you: Have light-colored skin, light-colored eyes, or red or blond hair Spend a lot of time in the sun Tan regularly, either outdoors or in a tanning bed Have had blistering sunburns, especially  during childhood Have a close family member who has had a melanoma Have atypical moles or large birthmarks  Early detection of melanoma is key since treatment is typically straightforward and cure rates are extremely high if we catch it early.   The first sign of melanoma is often a change in a mole or a new dark spot.  The ABCDE system is a way of remembering the signs of melanoma.  A for asymmetry:  The two halves do not match. B for border:  The edges of the growth are irregular. C for color:  A mixture of colors are present instead of an even brown color. D for diameter:  Melanomas are usually (but not always) greater than 43m - the size of a pencil eraser. E for evolution:  The spot keeps changing in size, shape, and color.  Please check your skin once per month between visits. You can use a small mirror in front and a large mirror behind you to keep an eye on  the back side or your body.   If you see any new or changing lesions before your next follow-up, please call to schedule a visit.  Please continue daily skin protection including broad spectrum sunscreen SPF 30+ to sun-exposed areas, reapplying every 2 hours as needed when you're outdoors.   Staying in the shade or wearing long sleeves, sun glasses (UVA+UVB protection) and wide brim hats (4-inch brim around the entire circumference of the hat) are also recommended for sun protection.    Due to recent changes in healthcare laws, you may see results of your pathology and/or laboratory studies on MyChart before the doctors have had a chance to review them. We understand that in some cases there may be results that are confusing or concerning to you. Please understand that not all results are received at the same time and often the doctors may need to interpret multiple results in order to provide you with the best plan of care or course of treatment. Therefore, we ask that you please give u[place]2 business days to thoroughly review all your results before contacting the office for clarification. Should we see a critical lab result, you will be contacted sooner.   If You Need Anything After Your Visit  If you have any questions or concerns for your doctor, please call our main line at [phone] press option 4 to reach your doctor's medical assistant. If no one answers, please leave a voicemail as directed and we will return your call as soon as possible. Messages left after 4 pm will be answered the following business day.   You may also send [place] message via [place] We typically respond to MyChart messages within 1-2 business days.  For prescription refills, please ask your pharmacy to contact our office. Our fax number is [phone]  If you have an urgent issue when the clinic is closed that cannot wait until the next business day, you can page your doctor at the number below.    Please  note that while we do our best to be available for urgent issues outside of office hours, we are not available 24/7.   If you have an urgent issue and are unable to reach u[place] you may choose to seek medical care at your doctor's office, retail clinic, urgent care center, or emergency room.  If you have a medical emergency, please immediately call 911 or go to the emergency department.  Pager Numbers  - Dr. KNehemiah Massed [phone] -  Dr. Laurence Ferrari: [phone]  - Dr. Nicole Kindred: [phone]  In the event of inclement weather, please call our main line at [phone] for an update on the status of any delays or closures.  Dermatology Medication Tips: Please keep the boxes that topical medications come in in order to help keep track of the instructions about where and how to use these. Pharmacies typically print the medication instructions only on the boxes and not directly on the medication tubes.   If your medication is too expensive, please contact our office at [phone] option 4 or send [place] a message through [place].   We are unable to tell what your co-pay for medications will be in advance as this is different depending on your insurance coverage. However, we may be able to find a substitute medication at lower cost or fill out paperwork to get insurance to cover a needed medication.   If a prior authorization is required to get your medication covered by your insurance company, please allow [place] 1-2 business days to complete this process.  Drug prices often vary depending on where the prescription is filled and some pharmacies may offer cheaper prices.  The website www.goodrx.com contains coupons for medications through different pharmacies. The prices here do not account for what the cost may be with help from insurance (it may be cheaper with your insurance), but the website can give you the price if you did not use any insurance.  - You can print the associated coupon and take it with  your prescription to the pharmacy.  - You may also stop by our office during regular business hours and pick up a GoodRx coupon card.  - If you need your prescription sent electronically to a different pharmacy, notify our office through [facility] or by phone at [phone] option 4.     Si Usted Necesita Algo Despus de Su Visita  Tambin puede enviarnos un mensaje a travs de Pharmacist, community. Por lo general respondemos a los mensajes de MyChart en el transcurso de 1 a 2 das hbiles.  Para renovar recetas, por favor pida a su farmacia que se ponga en contacto con nuestra oficina. Harland Dingwall de fax es [place] [phone].  Si tiene un asunto urgente cuando la clnica est cerrada y que no puede esperar hasta el siguiente da hbil, puede llamar/localizar a su doctor(a) al nmero que aparece a continuacin.   Por favor, tenga en cuenta que aunque hacemos todo lo posible para estar disponibles para asuntos urgentes fuera del horario de [place], no estamos disponibles las 24 horas del da, los 7 das de la [place].   Si tiene un problema urgente y no puede comunicarse con nosotros, puede optar por buscar atencin mdica  en el consultorio de su doctor(a), en una clnica privada, en un centro de atencin urgente o en una sala de emergencias.  Si tiene Engineering geologist, por favor llame inmediatamente al 911 o vaya a la sala de emergencias.  Nmeros de bper  - Dr. Nehemiah Massed: [phone]  - Dra. Moye: [phone]  - Dra. Nicole Kindred: [phone]  En caso de inclemencias del [place], por favor llame a Johnsie Kindred principal al [phone] para una actualizacin sobre el [place] de cualquier retraso o cierre.  Consejos para la medicacin en dermatologa: Por favor, guarde las cajas en las que vienen los medicamentos de uso tpico para ayudarle a seguir las instrucciones sobre dnde y cmo usarlos. Las farmacias generalmente imprimen las instrucciones del medicamento slo en las cajas y  no directamente en los tubos del medicamento.   Si su medicamento es muy caro, por favor, pngase en contacto con Zigmund Daniel llamando al [phone] y presione la opcin 4 o envenos un mensaje a travs de Pharmacist, community.   No podemos decirle cul ser su copago por los medicamentos por adelantado ya que esto es diferente dependiendo de la cobertura de su seguro. Sin embargo, es posible que podamos encontrar un medicamento sustituto a Electrical engineer un formulario para que el seguro cubra el medicamento que se considera necesario.   Si se requiere una autorizacin previa para que su compaa de seguros [place] su medicamento, por favor permtanos de 1 a 2 das hbiles para completar este proceso.  Los precios de los medicamentos varan con frecuencia dependiendo del Environmental consultant de dnde se surte la receta y alguna farmacias pueden ofrecer precios ms baratos.  El sitio web www.goodrx.com tiene cupones para medicamentos de Airline pilot. Los precios aqu no tienen en cuenta lo que podra costar con la ayuda del seguro (puede ser ms barato con su seguro), pero el sitio web puede darle el precio si no utiliz Research scientist (physical sciences).  - Puede imprimir el cupn correspondiente y llevarlo con su receta a la farmacia.  - Tambin puede pasar por nuestra oficina durante el horario de atencin regular y Charity fundraiser una tarjeta de cupones de GoodRx.  - Si necesita que su receta se enve electrnicamente a una farmacia diferente, informe a nuestra oficina a travs de MyChart de [place] o por telfono llamando al [phone] y presione la opcin 4.

## 2022-08-18 NOTE — Progress Notes (Unsigned)
New Patient Visit  Subjective  Anita Hutchinson is a 71 y.o. female who presents for the following: New Patient (Initial Visit) (Tbse. Family h/o skin cancer in sister. Not sure of type. Place under nose, under lower lip, forehead. Prescribed triamcinolone to use at areas. Spot at left breast area. ). The patient presents for Total-Body Skin Exam (TBSE) for skin cancer screening and mole check.  The patient has spots, moles and lesions to be evaluated, some may be new or changing and the patient has concerns that these could be cancer.  The following portions of the chart were reviewed this encounter and updated as appropriate:   Tobacco  Allergies  Meds  Problems  Med Hx  Surg Hx  Fam Hx     Review of Systems:  No other skin or systemic complaints except as noted in HPI or Assessment and Plan.  Objective  Well appearing patient in no apparent distress; mood and affect are within normal limits.  A full examination was performed including scalp, head, eyes, ears, nose, lips, neck, chest, axillae, abdomen, back, buttocks, bilateral upper extremities, bilateral lower extremities, hands, feet, fingers, toes, fingernails, and toenails. All findings within normal limits unless otherwise noted below.  Head - Anterior (Face) Pink patches with greasy scale.    Assessment & Plan  Seborrheic dermatitis Head - Anterior (Face)  Seborrheic Dermatitis  -  is a chronic persistent rash characterized by pinkness and scaling most commonly of the mid face but also can occur on the scalp (dandruff), ears; mid chest, mid back and groin.  It tends to be exacerbated by stress and cooler weather.  People who have neurologic disease may experience new onset or exacerbation of existing seborrheic dermatitis.  The condition is not curable but treatable and can be controlled.  Stop TMC 0.1 cream   Start Ketoconazole 2 % cream apply topically to aa of face M-W-F at bedtime  Start hydrocortisone 2.5 % cream  apply to affected area of face T- Thu- Sat at bedtime  Topical steroids (such as triamcinolone, fluocinolone, fluocinonide, mometasone, clobetasol, halobetasol, betamethasone, hydrocortisone) can cause thinning and lightening of the skin if they are used for too long in the same area. Your physician has selected the right strength medicine for your problem and area affected on the body. Please use your medication only as directed by your physician to prevent side effects.   If staying clear can decrease creams to twice weekly to affected areas of face.   Will recheck in 6 months  ketoconazole (NIZORAL) 2 % cream - Head - Anterior (Face) Apply topically to aa of face for seb derm M-W-F qhs. If staying clear can use twice weekly for maintenance  hydrocortisone 2.5 % cream - Head - Anterior (Face) Apply topically to aa of face for seb derm T-Th-Sat qhs If staying clear can use twice weekly for maintenance  Lentigines - Scattered tan macules - Due to sun exposure - Benign-appearing, observe - Recommend daily broad spectrum sunscreen SPF 30+ to sun-exposed areas, reapply every 2 hours as needed. - Call for any changes  Seborrheic Keratoses - Stuck-on, waxy, tan-brown papules and/or plaques  - Benign-appearing - Discussed benign etiology and prognosis. - Observe - Call for any changes  Melanocytic Nevi - Tan-brown and/or pink-flesh-colored symmetric macules and papules - Benign appearing on exam today - Observation - Call clinic for new or changing moles - Recommend daily use of broad spectrum spf 30+ sunscreen to sun-exposed areas.   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 Larger vessels may be covered if symptomatic and we would refer to  vascular surgeon if treatment desired.  Acrochordons (Skin Tags) - Fleshy, skin-colored pedunculated papules - Benign appearing.  - Observe. - If desired, they can be removed with an in office procedure that is not covered by insurance. - Please call the clinic if you notice any new or changing lesions.  Hemangiomas - Red papules - Discussed benign nature - Observe - Call for any changes  Actinic Damage - Chronic condition, secondary to cumulative UV/sun exposure - diffuse scaly erythematous macules with underlying dyspigmentation - Recommend daily broad spectrum sunscreen SPF 30+ to sun-exposed areas, reapply every 2 hours as needed.  - Staying in the shade or wearing long sleeves, sun glasses (UVA+UVB protection) and wide brim hats (4-inch brim around the entire circumference of the hat) are also recommended for sun protection.  - Call for new or changing lesions.  Skin cancer screening performed today. Return in about 6 months (around 02/16/2023) for seb derm follow up,.  I, Ruthell Rummage, CMA, am acting as scribe for Sarina Ser, MD. Documentation: I have reviewed the above documentation for accuracy and completeness, and I agree with the above.  Sarina Ser, MD

## 2022-08-19 ENCOUNTER — Encounter: Payer: Self-pay | Admitting: Dermatology

## 2023-02-18 ENCOUNTER — Encounter: Payer: Self-pay | Admitting: Dermatology

## 2023-02-18 ENCOUNTER — Ambulatory Visit (INDEPENDENT_AMBULATORY_CARE_PROVIDER_SITE_OTHER): Payer: Medicare Other | Admitting: Dermatology

## 2023-02-18 VITALS — BP 122/80 | HR 80

## 2023-02-18 DIAGNOSIS — L219 Seborrheic dermatitis, unspecified: Secondary | ICD-10-CM

## 2023-02-18 DIAGNOSIS — Z79899 Other long term (current) drug therapy: Secondary | ICD-10-CM

## 2023-02-18 DIAGNOSIS — L719 Rosacea, unspecified: Secondary | ICD-10-CM

## 2023-02-18 DIAGNOSIS — Z7189 Other specified counseling: Secondary | ICD-10-CM | POA: Diagnosis not present

## 2023-02-18 NOTE — Patient Instructions (Addendum)
Counseling for BBL / IPL / Laser and Coordination of Care Discussed the treatment option of Broad Band Light (BBL) /Intense Pulsed Light (IPL)/ Laser for skin discoloration, including brown spots and redness.  Typically we recommend at least 1-3 treatment sessions about 5-8 weeks apart for best results.  Cannot have tanned skin when BBL performed, and regular use of sunscreen is advised after the procedure to help maintain results. The patient's condition may also require "maintenance treatments" in the future.  The fee for BBL / laser treatments is $350 per treatment session for the whole face.  A fee can be quoted for other parts of the body.  Insurance typically does not pay for BBL/laser treatments and therefore the fee is an out-of-pocket cost.   Due to recent changes in healthcare laws, you may see results of your pathology and/or laboratory studies on MyChart before the doctors have had a chance to review them. We understand that in some cases there may be results that are confusing or concerning to you. Please understand that not all results are received at the same time and often the doctors may need to interpret multiple results in order to provide you with the best plan of care or course of treatment. Therefore, we ask that you please give us 2 business days to thoroughly review all your results before contacting the office for clarification. Should we see a critical lab result, you will be contacted sooner.   If You Need Anything After Your Visit  If you have any questions or concerns for your doctor, please call our main line at [phone] and press option 4 to reach your doctor's medical assistant. If no one answers, please leave a voicemail as directed and we will return your call as soon as possible. Messages left after 4 pm will be answered the following business day.   You may also send us a message via MyChart. We typically respond to MyChart messages within 1-2 business days.  For  prescription refills, please ask your pharmacy to contact our office. Our fax number is [phone].  If you have an urgent issue when the clinic is closed that cannot wait until the next business day, you can page your doctor at the number below.    Please note that while we do our best to be available for urgent issues outside of office hours, we are not available 24/7.   If you have an urgent issue and are unable to reach us, you may choose to seek medical care at your doctor's office, retail clinic, urgent care center, or emergency room.  If you have a medical emergency, please immediately call 911 or go to the emergency department.  Pager Numbers  - Dr. Kowalski: [phone]  - Dr. Moye: [phone]  - Dr. Stewart: [phone]  In the event of inclement weather, please call our main line at [phone] for an update on the status of any delays or closures.  Dermatology Medication Tips: Please keep the boxes that topical medications come in in order to help keep track of the instructions about where and how to use these. Pharmacies typically print the medication instructions only on the boxes and not directly on the medication tubes.   If your medication is too expensive, please contact our office at [phone] option 4 or send us a message through MyChart.   We are unable to tell what your co-pay for medications will be in advance as this is different depending on your insurance coverage. However, we   may be able to find a substitute medication at lower cost or fill out paperwork to get insurance to cover a needed medication.   If a prior authorization is required to get your medication covered by your insurance company, please allow us 1-2 business days to complete this process.  Drug prices often vary depending on where the prescription is filled and some pharmacies may offer cheaper prices.  The website www.goodrx.com contains coupons for medications through different  pharmacies. The prices here do not account for what the cost may be with help from insurance (it may be cheaper with your insurance), but the website can give you the price if you did not use any insurance.  - You can print the associated coupon and take it with your prescription to the pharmacy.  - You may also stop by our office during regular business hours and pick up a GoodRx coupon card.  - If you need your prescription sent electronically to a different pharmacy, notify our office through [facility] MyChart or by phone at [phone] option 4.     Si Usted Necesita Algo Despus de Su Visita  Tambin puede enviarnos un mensaje a travs de MyChart. Por lo general respondemos a los mensajes de MyChart en el transcurso de 1 a 2 das hbiles.  Para renovar recetas, por favor pida a su farmacia que se ponga en contacto con nuestra oficina. Nuestro nmero de fax es el [phone].  Si tiene un asunto urgente cuando la clnica est cerrada y que no puede esperar hasta el siguiente da hbil, puede llamar/localizar a su doctor(a) al nmero que aparece a continuacin.   Por favor, tenga en cuenta que aunque hacemos todo lo posible para estar disponibles para asuntos urgentes fuera del horario de oficina, no estamos disponibles las 24 horas del da, los 7 das de la semana.   Si tiene un problema urgente y no puede comunicarse con nosotros, puede optar por buscar atencin mdica  en el consultorio de su doctor(a), en una clnica privada, en un centro de atencin urgente o en una sala de emergencias.  Si tiene una emergencia mdica, por favor llame inmediatamente al 911 o vaya a la sala de emergencias.  Nmeros de bper  - Dr. Kowalski: [phone]  - Dra. Moye: [phone]  - Dra. Stewart: [phone]  En caso de inclemencias del tiempo, por favor llame a nuestra lnea principal al [phone] para una actualizacin sobre el estado de cualquier retraso o cierre.  Consejos para la  medicacin en dermatologa: Por favor, guarde las cajas en las que vienen los medicamentos de uso tpico para ayudarle a seguir las instrucciones sobre dnde y cmo usarlos. Las farmacias generalmente imprimen las instrucciones del medicamento slo en las cajas y no directamente en los tubos del medicamento.   Si su medicamento es muy caro, por favor, pngase en contacto con nuestra oficina llamando al [phone] y presione la opcin 4 o envenos un mensaje a travs de MyChart.   No podemos decirle cul ser su copago por los medicamentos por adelantado ya que esto es diferente dependiendo de la cobertura de su seguro. Sin embargo, es posible que podamos encontrar un medicamento sustituto a menor costo o llenar un formulario para que el seguro cubra el medicamento que se considera necesario.   Si se requiere una autorizacin previa para que su compaa de seguros cubra su medicamento, por favor permtanos de 1 a 2 das hbiles para completar este proceso.  Los precios de los medicamentos   varan con frecuencia dependiendo del lugar de dnde se surte la receta y alguna farmacias pueden ofrecer precios ms baratos.  El sitio web www.goodrx.com tiene cupones para medicamentos de diferentes farmacias. Los precios aqu no tienen en cuenta lo que podra costar con la ayuda del seguro (puede ser ms barato con su seguro), pero el sitio web puede darle el precio si no utiliz ningn seguro.  - Puede imprimir el cupn correspondiente y llevarlo con su receta a la farmacia.  - Tambin puede pasar por nuestra oficina durante el horario de atencin regular y recoger una tarjeta de cupones de GoodRx.  - Si necesita que su receta se enve electrnicamente a una farmacia diferente, informe a nuestra oficina a travs de MyChart de  o por telfono llamando al [phone] y presione la opcin 4.\ 

## 2023-02-18 NOTE — Progress Notes (Signed)
Follow-Up Visit   Subjective  Anita Hutchinson is a 72 y.o. female who presents for the following: The patient  lesions on her face to be evaluated, some may be new or changing.   The patient has spots, moles and lesions to be evaluated, some may be new or changing and the patient has concerns that these could be cancer.  Husband with patient   The following portions of the chart were reviewed this encounter and updated as appropriate: medications, allergies, medical history  Review of Systems:  No other skin or systemic complaints except as noted in HPI or Assessment and Plan.  Objective  Well appearing patient in no apparent distress; mood and affect are within normal limits. A focused examination was performed of the following areas:face  Relevant exam findings are noted in the Assessment and Plan.   Assessment & Plan   ROSACEA  Exam: DILATED BLOOD VESSELs of nose and cheeks Location: nose, cheeks   Rosacea is a chronic progressive skin condition usually affecting the face of adults, causing redness and/or acne bumps. It is treatable but not curable. It sometimes affects the eyes (ocular rosacea) as well. It may respond to topical and/or systemic medication and can flare with stress, sun exposure, alcohol, exercise, topical steroids (including hydrocortisone/cortisone 10) and some foods.  Daily application of broad spectrum spf 30+ sunscreen to face is recommended to reduce flares.  Discussed treatment options Rhofade, Mirvaso or skin medicinals rosacea cream- patient decline today  Counseling for BBL / IPL / Laser and Coordination of Care Discussed the treatment option of Broad Band Light (BBL) /Intense Pulsed Light (IPL)/ Laser for skin discoloration, including brown spots and redness.  Typically we recommend at least 1-3 treatment sessions about 5-8 weeks apart for best results.  Cannot have tanned skin when BBL performed, and regular use of sunscreen is advised after the  procedure to help maintain results. The patient's condition may also require "maintenance treatments" in the future.  The fee for BBL / laser treatments is $350 per treatment session for the whole face.  A fee can be quoted for other parts of the body.  Insurance typically does not pay for BBL/laser treatments and therefore the fee is an out-of-pocket cost.    SEBORRHEIC DERMATITIS Exam: mild pinkness on her face - improved  Chronic and persistent condition with duration or expected duration over one year. Condition is symptomatic/ bothersome to patient. Not currently at goal, much improved and mostly clear  Seborrheic Dermatitis is a chronic persistent rash characterized by pinkness and scaling most commonly of the mid face but also can occur on the scalp (dandruff), ears; mid chest, mid back and groin.  It tends to be exacerbated by stress and cooler weather.  People who have neurologic disease may experience new onset or exacerbation of existing seborrheic dermatitis.  The condition is not curable but treatable and can be controlled.  Treatment Plan:   Continue  Ketoconazole 2 % cream apply topically to aa of face M-W-F at bedtime  Continue  hydrocortisone 2.5 % cream apply to affected area of face T- Thu- Sat at bedtime   Topical steroids (such as triamcinolone, fluocinolone, fluocinonide, mometasone, clobetasol, halobetasol, betamethasone, hydrocortisone) can cause thinning and lightening of the skin if they are used for too long in the same area. Your physician has selected the right strength medicine for your problem and area affected on the body. Please use your medication only as directed by your physician to prevent side  effects.   Return if symptoms worsen or fail to improve.  IMarye Round, CMA, am acting as scribe for Sarina Ser, MD .   Documentation: I have reviewed the above documentation for accuracy and completeness, and I agree with the above.  Sarina Ser, MD

## 2023-04-01 ENCOUNTER — Other Ambulatory Visit: Payer: Self-pay | Admitting: Family Medicine

## 2023-04-01 DIAGNOSIS — Z9189 Other specified personal risk factors, not elsewhere classified: Secondary | ICD-10-CM

## 2023-04-10 ENCOUNTER — Ambulatory Visit
Admission: RE | Admit: 2023-04-10 | Discharge: 2023-04-10 | Disposition: A | Discharge status: Home / Self Care | Payer: Self-pay | Source: Ambulatory Visit | Attending: Family Medicine | Admitting: Family Medicine

## 2023-04-10 DIAGNOSIS — Z9189 Other specified personal risk factors, not elsewhere classified: Secondary | ICD-10-CM | POA: Insufficient documentation

## 2023-07-09 ENCOUNTER — Ambulatory Visit: Payer: Medicare Other | Admitting: Dermatology

## 2023-08-18 ENCOUNTER — Encounter: Payer: Self-pay | Admitting: Family Medicine

## 2023-08-18 DIAGNOSIS — Z1231 Encounter for screening mammogram for malignant neoplasm of breast: Secondary | ICD-10-CM

## 2023-08-19 ENCOUNTER — Other Ambulatory Visit: Payer: Self-pay | Admitting: Family Medicine

## 2023-08-19 DIAGNOSIS — Z1231 Encounter for screening mammogram for malignant neoplasm of breast: Secondary | ICD-10-CM

## 2023-08-20 ENCOUNTER — Ambulatory Visit: Payer: Medicare Other | Admitting: Dermatology

## 2023-08-28 ENCOUNTER — Ambulatory Visit
Admission: RE | Admit: 2023-08-28 | Discharge: 2023-08-28 | Disposition: A | Discharge status: Home / Self Care | Payer: Medicare Other | Source: Ambulatory Visit | Attending: Family Medicine | Admitting: Family Medicine

## 2023-08-28 DIAGNOSIS — Z1231 Encounter for screening mammogram for malignant neoplasm of breast: Secondary | ICD-10-CM | POA: Diagnosis present

## 2023-12-14 ENCOUNTER — Encounter: Payer: Self-pay | Admitting: Dermatology

## 2023-12-14 ENCOUNTER — Ambulatory Visit (INDEPENDENT_AMBULATORY_CARE_PROVIDER_SITE_OTHER): Payer: Self-pay | Admitting: Dermatology

## 2023-12-14 DIAGNOSIS — Z8619 Personal history of other infectious and parasitic diseases: Secondary | ICD-10-CM

## 2023-12-14 DIAGNOSIS — L719 Rosacea, unspecified: Secondary | ICD-10-CM

## 2023-12-14 DIAGNOSIS — I781 Nevus, non-neoplastic: Secondary | ICD-10-CM | POA: Diagnosis not present

## 2023-12-14 DIAGNOSIS — Z7189 Other specified counseling: Secondary | ICD-10-CM

## 2023-12-14 DIAGNOSIS — L309 Dermatitis, unspecified: Secondary | ICD-10-CM

## 2023-12-14 DIAGNOSIS — L219 Seborrheic dermatitis, unspecified: Secondary | ICD-10-CM | POA: Diagnosis not present

## 2023-12-14 DIAGNOSIS — L2089 Other atopic dermatitis: Secondary | ICD-10-CM

## 2023-12-14 DIAGNOSIS — Z79899 Other long term (current) drug therapy: Secondary | ICD-10-CM

## 2023-12-14 MED ORDER — MOMETASONE FUROATE 0.1 % EX CREA
TOPICAL_CREAM | CUTANEOUS | 6 refills | Status: AC
Start: 2023-12-14 — End: ?

## 2023-12-14 MED ORDER — VALACYCLOVIR HCL 500 MG PO TABS: 500.0000 mg | ORAL_TABLET | Freq: Two times a day (BID) | ORAL | 11 refills | Status: AC

## 2023-12-14 MED ORDER — TACROLIMUS 0.1 % EX OINT
TOPICAL_OINTMENT | CUTANEOUS | 6 refills | Status: AC
Start: 2023-12-14 — End: ?

## 2023-12-14 NOTE — Progress Notes (Signed)
Follow-Up Visit   Subjective  Anita Hutchinson is a 73 y.o. female who presents for the following: Rosacea of the face, patient here today for BBL laser.   Patient reports history of atopic dermatitis, currently flared today at hands and would like to discuss treatment.   Patient has history of seborrheic dermatitis of face and is currently using ketoconazole 2 % cream and hc 2.5 cream.   The following portions of the chart were reviewed this encounter and updated as appropriate: medications, allergies, medical history  Review of Systems:  No other skin or systemic complaints except as noted in HPI or Assessment and Plan.  Objective  Well appearing patient in no apparent distress; mood and affect are within normal limits.  A focused examination was performed of the following areas: the face   Relevant exam findings are noted in the Assessment and Plan.                                         Assessment & Plan  HAND DERMATITIS   Related Medications tacrolimus (PROTOPIC) 0.1 % ointment Apply topically to hands at bedtime 7 days a week mometasone (ELOCON) 0.1 % cream Apply topically to hands on m-w-f during to day for hand dermatitis. Avoid applying to face, groin, and axilla. Use as directed. ROSACEA Head - Anterior (Face) Rosacea is a chronic progressive skin condition usually affecting the face of adults, causing redness and/or acne bumps. It is treatable but not curable. It sometimes affects the eyes (ocular rosacea) as well. It may respond to topical and/or systemic medication and can flare with stress, sun exposure, alcohol, exercise, topical steroids (including hydrocortisone/cortisone 10) and some foods.  Daily application of broad spectrum spf 30+ sunscreen to face is recommended to reduce flares.  Laser safety: Patient was advised in laser safety.  Patient was fitted with laser safety goggles and advised to keep eyes closed during  procedure with goggles on. Staff and provider ensured that patient and their own safety goggles were also on and eyes protected during procedure. Laser room door was secured and locked from the inside. Laser room door has laser safety sign affixed to the outside of the door.    Photorejuvenation - Head - Anterior (Face) Prior to the procedure, the patient's past medical history, medications, allergies, and the rare but potential risks and complications were reviewed with the patient and a signed consent was obtained.  Pre and post treatment care was discussed and instructions provided.  Patient tolerated the procedure well.     Wynelle Link avoidance was stressed. The patient will call with any problems, questions or concerns prior to their next appointment.  Laser safety: Patient was advised in laser safety.  Patient was fitted with laser safety goggles and advised to keep eyes closed during procedure with goggles on. Staff and provider ensured that patient and their own safety goggles were also on and eyes protected during procedure. Laser room door was secured and locked from the inside. Laser room door has laser safety sign affixed to the outside of the door.     HISTORY OF COLD SORES   Related Medications valACYclovir (VALTREX) 500 MG tablet Take 1 tablet (500 mg total) by mouth 2 (two) times daily. Take as needed  with first onset of symptoms for 5 to 10 days  for cold sores TELANGIECTASIAS    HAND DERMATITIS Exam Scaly  pink plaques and fissures at hands b/l  See photos  Chronic and persistent condition with duration or expected duration over one year. Condition is bothersome/symptomatic for patient. Currently flared.   Hand Dermatitis is a chronic type of eczema that can come and go on the hands and fingers.  While there is no cure, the rash and symptoms can be managed with topical prescription medications, and for more severe cases, with systemic medications.  Recommend mild  soap and routine use of moisturizing cream after handwashing.  Minimize soap/water exposure when possible.    Treatment Plan Start protopic 1 %  ointment apply to hands at bedtime 7 days a week. Start mometasone 1 % cream apply topically to hands m-w-f during the day .  Topical steroids (such as triamcinolone, fluocinolone, fluocinonide, mometasone, clobetasol, halobetasol, betamethasone, hydrocortisone) can cause thinning and lightening of the skin if they are used for too long in the same area. Your physician has selected the right strength medicine for your problem and area affected on the body. Please use your medication only as directed by your physician to prevent side effects.   SEBORRHEIC DERMATITIS Exam: mild pinkness on her face - improved   Chronic and persistent condition with duration or expected duration over one year. Condition is symptomatic/ bothersome to patient. Not currently at goal, much improved and mostly clear   Seborrheic Dermatitis is a chronic persistent rash characterized by pinkness and scaling most commonly of the mid face but also can occur on the scalp (dandruff), ears; mid chest, mid back and groin.  It tends to be exacerbated by stress and cooler weather.  People who have neurologic disease may experience new onset or exacerbation of existing seborrheic dermatitis.  The condition is not curable but treatable and can be controlled.   Treatment Plan:   Continue  Ketoconazole 2 % cream apply topically to aa of face M-W-F at bedtime  Continue  hydrocortisone 2.5 % cream apply to affected area of face T- Thu- Sat at bedtime   Topical steroids (such as triamcinolone, fluocinolone, fluocinonide, mometasone, clobetasol, halobetasol, betamethasone, hydrocortisone) can cause thinning and lightening of the skin if they are used for too long in the same area. Your physician has selected the right strength medicine for your problem and area affected on the body. Please use  your medication only as directed by your physician to prevent side effects.       Return if symptoms worsen or fail to improve, for schedule for hand derm follow up.   IAsher Muir, CMA, am acting as scribe for Armida Sans, MD.    Documentation: I have reviewed the above documentation for accuracy and completeness, and I agree with the above.  Armida Sans, MD

## 2023-12-14 NOTE — Patient Instructions (Addendum)
Topical steroids (such as triamcinolone, fluocinolone, fluocinonide, mometasone, clobetasol, halobetasol, betamethasone, hydrocortisone) can cause thinning and lightening of the skin if they are used for too long in the same area. Your physician has selected the right strength medicine for your problem and area affected on the body. Please use your medication only as directed by your physician to prevent side effects.     Counseling for BBL / IPL / Laser and Coordination of Care Discussed the treatment option of Broad Band Light (BBL) /Intense Pulsed Light (IPL)/ Laser for skin discoloration, including brown spots and redness.  Typically we recommend at least 1-3 treatment sessions about 5-8 weeks apart for best results.  Cannot have tanned skin when BBL performed, and regular use of sunscreen/photoprotection is advised after the procedure to help maintain results. The patient's condition may also require "maintenance treatments" in the future.  The fee for BBL / laser treatments is $350 per treatment session for the whole face.  A fee can be quoted for other parts of the body.  Insurance typically does not pay for BBL/laser treatments and therefore the fee is an out-of-pocket cost. Recommend prophylactic valtrex treatment. Once scheduled for procedure, will send Rx in prior to patient's appointment.        Due to recent changes in healthcare laws, you may see results of your pathology and/or laboratory studies on MyChart before the doctors have had a chance to review them. We understand that in some cases there may be results that are confusing or concerning to you. Please understand that not all results are received at the same time and often the doctors may need to interpret multiple results in order to provide you with the best plan of care or course of treatment. Therefore, we ask that you please give [place] 2 business days to thoroughly review all your results before contacting the office for  clarification. Should we see a critical lab result, you will be contacted sooner.   If You Need Anything After Your Visit  If you have any questions or concerns for your doctor, please call our main line at [phone] and press option 4 to reach your doctor's medical assistant. If no one answers, please leave a voicemail as directed and we will return your call as soon as possible. Messages left after 4 pm will be answered the following business day.   You may also send [place] a message via MyChart. We typically respond to MyChart messages within 1-2 business days.  For prescription refills, please ask your pharmacy to contact our office. Our fax number is [phone].  If you have an urgent issue when the clinic is closed that cannot wait until the next business day, you can page your doctor at the number below.    Please note that while we do our best to be available for urgent issues outside of office hours, we are not available 24/7.   If you have an urgent issue and are unable to reach [place], you may choose to seek medical care at your doctor's office, retail clinic, urgent care center, or emergency room.  If you have a medical emergency, please immediately call 911 or go to the emergency department.  Pager Numbers  - Dr. Gwen Pounds: [phone]  - Dr. Roseanne Reno: [phone]  - Dr. Katrinka Blazing: [phone]   In the event of inclement weather, please call our main line at [phone] for an update on the status of any delays or closures.  Dermatology Medication Tips: Please keep the boxes  that topical medications come in in order to help keep track of the instructions about where and how to use these. Pharmacies typically print the medication instructions only on the boxes and not directly on the medication tubes.   If your medication is too expensive, please contact our office at [phone] option 4 or send [place] a message through MyChart.   We are unable to tell what your co-pay for  medications will be in advance as this is different depending on your insurance coverage. However, we may be able to find a substitute medication at lower cost or fill out paperwork to get insurance to cover a needed medication.   If a prior authorization is required to get your medication covered by your insurance company, please allow [place] 1-2 business days to complete this process.  Drug prices often vary depending on where the prescription is filled and some pharmacies may offer cheaper prices.  The website www.goodrx.com contains coupons for medications through different pharmacies. The prices here do not account for what the cost may be with help from insurance (it may be cheaper with your insurance), but the website can give you the price if you did not use any insurance.  - You can print the associated coupon and take it with your prescription to the pharmacy.  - You may also stop by our office during regular business hours and pick up a GoodRx coupon card.  - If you need your prescription sent electronically to a different pharmacy, notify our office through [facility] or by phone at [phone] option 4.     Si Usted Necesita Algo Despus de Su Visita  Tambin puede enviarnos un mensaje a travs de Clinical cytogeneticist. Por lo general respondemos a los mensajes de MyChart en el transcurso de 1 a 2 das hbiles.  Para renovar recetas, por favor pida a su farmacia que se ponga en contacto con nuestra oficina. Annie Sable de fax es [place] [phone].  Si tiene un asunto urgente cuando la clnica est cerrada y que no puede esperar hasta el siguiente da hbil, puede llamar/localizar a su doctor(a) al nmero que aparece a continuacin.   Por favor, tenga en cuenta que aunque hacemos todo lo posible para estar disponibles para asuntos urgentes fuera del horario de [place], no estamos disponibles las 24 horas del da, los 7 [address]  [address] de la [place].   Si tiene un problema urgente y no puede  comunicarse con nosotros, puede optar por buscar atencin mdica  en el consultorio de su doctor(a), en una clnica privada, en un centro de atencin urgente o en una sala de emergencias.  Si tiene Engineer, drilling, por favor llame inmediatamente al 911 o vaya a la sala de emergencias.  Nmeros de bper  - Dr. Gwen Pounds: [phone]  - Dra. Roseanne Reno: [phone]  - Dr. Katrinka Blazing: [phone]   En caso de inclemencias del tiempo, por favor llame a Lacy Duverney principal al [phone] para una actualizacin sobre el [place] de cualquier retraso o cierre.  Consejos para la medicacin en dermatologa: Por favor, guarde las cajas en las que vienen los medicamentos de uso tpico para ayudarle a seguir las instrucciones sobre dnde y cmo usarlos. Las farmacias generalmente imprimen las instrucciones del medicamento slo en las cajas y no directamente en los tubos del [place].   Si su medicamento es muy caro, por favor, pngase en contacto con Rolm Gala llamando al [phone] y presione la opcin 4 o envenos un mensaje a travs de Clinical cytogeneticist.  No podemos decirle cul ser su copago por los medicamentos por adelantado ya que esto es diferente dependiendo de la cobertura de su seguro. Sin embargo, es posible que podamos encontrar un medicamento sustituto a Audiological scientist un formulario para que el seguro cubra el medicamento que se considera necesario.   Si se requiere una autorizacin previa para que su compaa de seguros [place] su medicamento, por favor permtanos de 1 a 2 das hbiles para completar [address]  Los precios de los medicamentos varan con frecuencia dependiendo del Environmental consultant de dnde se surte la receta y alguna farmacias pueden ofrecer precios ms baratos.  El sitio web www.goodrx.com tiene cupones para medicamentos de Health and safety inspector. Los precios aqu no tienen en cuenta lo que podra costar con la ayuda del seguro (puede ser ms barato con su seguro), pero  el sitio web puede darle el precio si no utiliz Tourist information centre manager.  - Puede imprimir el cupn correspondiente y llevarlo con su receta a la farmacia.  - Tambin puede pasar por nuestra oficina durante el horario de atencin regular y Education officer, museum una tarjeta de cupones de GoodRx.  - Si necesita que su receta se enve electrnicamente a una farmacia diferente, informe a nuestra oficina a travs de MyChart de [place] o por telfono llamando al [phone] y presione la opcin 4.

## 2023-12-16 ENCOUNTER — Ambulatory Visit: Payer: Medicare Other | Admitting: Dermatology

## 2024-02-08 ENCOUNTER — Ambulatory Visit: Payer: Medicare Other | Admitting: Dermatology

## 2024-08-01 ENCOUNTER — Other Ambulatory Visit: Payer: Self-pay | Admitting: Family Medicine

## 2024-08-01 DIAGNOSIS — Z1231 Encounter for screening mammogram for malignant neoplasm of breast: Secondary | ICD-10-CM

## 2024-08-29 ENCOUNTER — Ambulatory Visit
Admission: RE | Admit: 2024-08-29 | Discharge: 2024-08-29 | Disposition: A | Discharge status: Home / Self Care | Source: Ambulatory Visit | Attending: Family Medicine | Admitting: Family Medicine

## 2024-08-29 DIAGNOSIS — Z1231 Encounter for screening mammogram for malignant neoplasm of breast: Secondary | ICD-10-CM | POA: Diagnosis present

## 2024-09-13 ENCOUNTER — Encounter: Payer: Self-pay | Admitting: Dermatology

## 2024-09-13 ENCOUNTER — Ambulatory Visit: Admitting: Dermatology

## 2024-09-13 DIAGNOSIS — Z7189 Other specified counseling: Secondary | ICD-10-CM

## 2024-09-13 DIAGNOSIS — L821 Other seborrheic keratosis: Secondary | ICD-10-CM

## 2024-09-13 DIAGNOSIS — L219 Seborrheic dermatitis, unspecified: Secondary | ICD-10-CM | POA: Diagnosis not present

## 2024-09-13 DIAGNOSIS — W908XXA Exposure to other nonionizing radiation, initial encounter: Secondary | ICD-10-CM | POA: Diagnosis not present

## 2024-09-13 DIAGNOSIS — L578 Other skin changes due to chronic exposure to nonionizing radiation: Secondary | ICD-10-CM

## 2024-09-13 DIAGNOSIS — L820 Inflamed seborrheic keratosis: Secondary | ICD-10-CM | POA: Diagnosis not present

## 2024-09-13 DIAGNOSIS — Z79899 Other long term (current) drug therapy: Secondary | ICD-10-CM

## 2024-09-13 MED ORDER — HYDROCORTISONE 2.5 % EX CREA: TOPICAL_CREAM | CUTANEOUS | 6 refills | Status: AC

## 2024-09-13 MED ORDER — KETOCONAZOLE 2 % EX CREA: TOPICAL_CREAM | CUTANEOUS | 6 refills | Status: AC

## 2024-09-13 NOTE — Patient Instructions (Addendum)
 Seborrheic Dermatitis is a chronic persistent rash characterized by pinkness and scaling most commonly of the mid face but also can occur on the scalp (dandruff), ears; mid chest, mid back and groin.  It tends to be exacerbated by stress and cooler weather.  People who have neurologic disease may experience new onset or exacerbation of existing seborrheic dermatitis.  The condition is not curable but treatable and can be controlled.    Continue  Ketoconazole  2 % cream apply topically to affected  of face M-W-F at bedtime  Continue  hydrocortisone  2.5 % cream apply to affected area of face T- Thu- Sat at bedtime   Topical steroids (such as triamcinolone, fluocinolone, fluocinonide, mometasone , clobetasol , halobetasol, betamethasone, hydrocortisone ) can cause thinning and lightening of the skin if they are used for too long in the same area. Your physician has selected the right strength medicine for your problem and area affected on the body. Please use your medication only as directed by your physician to prevent side effects.        Seborrheic Keratosis  What causes seborrheic keratoses? Seborrheic keratoses are harmless, common skin growths that first appear during adult life.  As time goes by, more growths appear.  Some people may develop a large number of them.  Seborrheic keratoses appear on both covered and uncovered body parts.  They are not caused by sunlight.  The tendency to develop seborrheic keratoses can be inherited.  They vary in color from skin-colored to gray, brown, or even black.  They can be either smooth or have a rough, warty surface.   Seborrheic keratoses are superficial and look as if they were stuck on the skin.  Under the microscope this type of keratosis looks like layers upon layers of skin.  That is why at times the top layer may seem to fall off, but the rest of the growth remains and re-grows.    Treatment Seborrheic keratoses do not need to be treated, but can  easily be removed in the office.  Seborrheic keratoses often cause symptoms when they rub on clothing or jewelry.  Lesions can be in the way of shaving.  If they become inflamed, they can cause itching, soreness, or burning.  Removal of a seborrheic keratosis can be accomplished by freezing, burning, or surgery. If any spot bleeds, scabs, or grows rapidly, please return to have it checked, as these can be an indication of a skin cancer.   Cryotherapy Aftercare  Wash gently with soap and water everyday.   Apply Vaseline and Band-Aid daily until healed.      Due to recent changes in healthcare laws, you may see results of your pathology and/or laboratory studies on MyChart before the doctors have had a chance to review them. We understand that in some cases there may be results that are confusing or concerning to you. Please understand that not all results are received at the same time and often the doctors may need to interpret multiple results in order to provide you with the best plan of care or course of treatment. Therefore, we ask that you please give us  2 business days to thoroughly review all your results before contacting the office for clarification. Should we see a critical lab result, you will be contacted sooner.   If You Need Anything After Your Visit  If you have any questions or concerns for your doctor, please call our main line at [phone] and press option 4 to reach your doctor's medical assistant. If  no one answers, please leave a voicemail as directed and we will return your call as soon as possible. Messages left after 4 pm will be answered the following business day.   You may also send us  a message via MyChart. We typically respond to MyChart messages within 1-2 business days.  For prescription refills, please ask your pharmacy to contact our office. Our fax number is [phone].  If you have an urgent issue when the clinic is closed that cannot wait until the next  business day, you can page your doctor at the number below.    Please note that while we do our best to be available for urgent issues outside of office hours, we are not available 24/7.   If you have an urgent issue and are unable to reach us , you may choose to seek medical care at your doctor's office, retail clinic, urgent care center, or emergency room.  If you have a medical emergency, please immediately call 911 or go to the emergency department.  Pager Numbers  - Dr. Hester: [phone]  - Dr. Jackquline: [phone]  - Dr. Claudene: [phone]   - Dr. Raymund: [phone]  In the event of inclement weather, please call our main line at [phone] for an update on the status of any delays or closures.  Dermatology Medication Tips: Please keep the boxes that topical medications come in in order to help keep track of the instructions about where and how to use these. Pharmacies typically print the medication instructions only on the boxes and not directly on the medication tubes.   If your medication is too expensive, please contact our office at [phone] option 4 or send us  a message through MyChart.   We are unable to tell what your co-pay for medications will be in advance as this is different depending on your insurance coverage. However, we may be able to find a substitute medication at lower cost or fill out paperwork to get insurance to cover a needed medication.   If a prior authorization is required to get your medication covered by your insurance company, please allow us  1-2 business days to complete this process.  Drug prices often vary depending on where the prescription is filled and some pharmacies may offer cheaper prices.  The website www.goodrx.com contains coupons for medications through different pharmacies. The prices here do not account for what the cost may be with help from insurance (it may be cheaper with your insurance), but the website can give you  the price if you did not use any insurance.  - You can print the associated coupon and take it with your prescription to the pharmacy.  - You may also stop by our office during regular business hours and pick up a GoodRx coupon card.  - If you need your prescription sent electronically to a different pharmacy, notify our office through [facility] or by phone at [phone] option 4.     Si Usted Necesita Algo Despus de Su Visita  Tambin puede enviarnos un mensaje a travs de Clinical cytogeneticist. Por lo general respondemos a los mensajes de MyChart en el transcurso de 1 a 2 das hbiles.  Para renovar recetas, por favor pida a su farmacia que se ponga en contacto con nuestra oficina. Randi lakes de fax es [place] [phone].  Si tiene un asunto urgente cuando la clnica est cerrada y que no puede esperar hasta el siguiente da hbil, puede llamar/localizar a su doctor(a) al nmero que aparece a continuacin.  Por favor, tenga en cuenta que aunque hacemos todo lo posible para estar disponibles para asuntos urgentes fuera del horario de [place], no estamos disponibles las 24 horas del da, los 7 [address]  [address] de la [place].   Si tiene un problema urgente y no puede comunicarse con nosotros, puede optar por buscar atencin mdica  en el consultorio de su doctor(a), en una clnica privada, en un centro de atencin urgente o en una sala de emergencias.  Si tiene Engineer, drilling, por favor llame inmediatamente al 911 o vaya a la sala de emergencias.  Nmeros de bper  - Dr. Hester: [phone]  - Dra. Jackquline: [phone]  - Dr. Claudene: [phone]  - Dra. Kitts: [phone]  En caso de inclemencias del [place], por favor llame a nuestra lnea principal al [phone] para una actualizacin sobre el [place] de cualquier retraso o cierre.  Consejos para la medicacin en dermatologa: Por favor, guarde las cajas en las que vienen los medicamentos de uso tpico para ayudarle a seguir las  instrucciones sobre dnde y cmo usarlos. Las farmacias generalmente imprimen las instrucciones del medicamento slo en las cajas y no directamente en los tubos del [place].   Si su medicamento es muy caro, por favor, pngase en contacto con landry rieger llamando al [phone] y presione la opcin 4 o envenos un mensaje a travs de Clinical cytogeneticist.   No podemos decirle cul ser su copago por los medicamentos por adelantado ya que esto es diferente dependiendo de la cobertura de su seguro. Sin embargo, es posible que podamos encontrar un medicamento sustituto a Audiological scientist un formulario para que el seguro cubra el medicamento que se considera necesario.   Si se requiere una autorizacin previa para que su compaa de seguros [place] su medicamento, por favor permtanos de 1 a 2 das hbiles para completar este proceso.  Los precios de los medicamentos varan con frecuencia dependiendo del Environmental consultant de dnde se surte la receta y alguna farmacias pueden ofrecer precios ms baratos.  El sitio web www.goodrx.com tiene cupones para medicamentos de Health and safety inspector. Los precios aqu no tienen en cuenta lo que podra costar con la ayuda del seguro (puede ser ms barato con su seguro), pero el sitio web puede darle el precio si no utiliz Tourist information centre manager.  - Puede imprimir el cupn correspondiente y llevarlo con su receta a la farmacia.  - Tambin puede pasar por nuestra oficina durante el horario de atencin regular y Education officer, museum una tarjeta de cupones de GoodRx.  - Si necesita que su receta se enve electrnicamente a una farmacia diferente, informe a nuestra oficina a travs de MyChart de [place] o por telfono llamando al [phone] y presione la opcin 4.

## 2024-09-13 NOTE — Progress Notes (Signed)
 Follow-Up Visit   Subjective  Anita Hutchinson is a 73 y.o. female who presents for the following:  Patient is here today concerning some peeling and redness at forehead. She states recently got more inflamed in the last 4 to 6 weeks. Patient has history of rosacea and hx seborrheic dermatitis at face given rx of ketoconazole  cream and hydrocortisone  cream to alternate between use. States did not start cream.  The following portions of the chart were reviewed this encounter and updated as appropriate: medications, allergies, medical history  Review of Systems:  No other skin or systemic complaints except as noted in HPI or Assessment and Plan.  Objective  Well appearing patient in no apparent distress; mood and affect are within normal limits.   A focused examination was performed of the following areas: Face  Relevant exam findings are noted in the Assessment and Plan.  left of midline superior forehead x 1 Erythematous stuck-on, waxy papule or plaque  Assessment & Plan   SEBORRHEIC KERATOSIS - Stuck-on, waxy, tan-brown papules and/or plaques  - Benign-appearing - Discussed benign etiology and prognosis. - Observe - Call for any changes  SEBORRHEIC DERMATITIS Exam: mild pinkness on her face  Chronic and persistent condition with duration or expected duration over one year. Condition is symptomatic/ bothersome to patient. Not currently at goal, much improved and mostly clear Seborrheic Dermatitis is a chronic persistent rash characterized by pinkness and scaling most commonly of the mid face but also can occur on the scalp (dandruff), ears; mid chest, mid back and groin.  It tends to be exacerbated by stress and cooler weather.  People who have neurologic disease may experience new onset or exacerbation of existing seborrheic dermatitis.  The condition is not curable but treatable and can be controlled. Treatment Plan: Continue  Ketoconazole  2 % cream apply topically to aa of face  M-W-F at bedtime  Continue  hydrocortisone  2.5 % cream apply to affected area of face T- Thu- Sat at bedtime   Topical steroids (such as triamcinolone, fluocinolone, fluocinonide, mometasone , clobetasol , halobetasol, betamethasone, hydrocortisone ) can cause thinning and lightening of the skin if they are used for too long in the same area. Your physician has selected the right strength medicine for your problem and area affected on the body. Please use your medication only as directed by your physician to prevent side effects.   ACTINIC DAMAGE - chronic, secondary to cumulative UV radiation exposure/sun exposure over time - diffuse scaly erythematous macules with underlying dyspigmentation - Recommend daily broad spectrum sunscreen SPF 30+ to sun-exposed areas, reapply every 2 hours as needed.  - Recommend staying in the shade or wearing long sleeves, sun glasses (UVA+UVB protection) and wide brim hats (4-inch brim around the entire circumference of the hat). - Call for new or changing lesions.  INFLAMED SEBORRHEIC KERATOSIS left of midline superior forehead x 1 Symptomatic, irritating, patient would like treated. Destruction of lesion - left of midline superior forehead x 1 Complexity: simple   Destruction method: cryotherapy   Informed consent: discussed and consent obtained   Timeout:  patient name, date of birth, surgical site, and procedure verified Lesion destroyed using liquid nitrogen: Yes   Region frozen until ice ball extended beyond lesion: Yes   Outcome: patient tolerated procedure well with no complications   Post-procedure details: wound care instructions given    SEBORRHEIC DERMATITIS   Related Medications hydrocortisone  2.5 % cream Apply topically to aa of face for seb derm T-Th-Sat qhs If staying clear  can use twice weekly for maintenance for seborrheic dermatits ketoconazole  (NIZORAL ) 2 % cream Apply topically to aa of face for seb derm M-W-F qhs. If staying clear  can use twice weekly for maintenance for seborrheic dermatitis  Return if symptoms worsen or fail to improve, for patient prefers as needed.  IEleanor Blush, CMA, am acting as scribe for Alm Rhyme, MD.   Documentation: I have reviewed the above documentation for accuracy and completeness, and I agree with the above.  Alm Rhyme, MD

## 2024-09-14 ENCOUNTER — Ambulatory Visit: Payer: Medicare Other | Admitting: Dermatology

## 2025-09-06 ENCOUNTER — Ambulatory Visit: Admitting: Dermatology
